# Patient Record
Sex: Female | Born: 1955 | Race: White | Hispanic: No | Marital: Married | Smoking: Never smoker
Health system: Southern US, Community
[De-identification: ages and names within clinical notes are randomized; demographics above are authoritative.]

## PROBLEM LIST (undated history)

## (undated) DIAGNOSIS — I10 Essential (primary) hypertension: Secondary | ICD-10-CM

## (undated) DIAGNOSIS — D649 Anemia, unspecified: Secondary | ICD-10-CM

## (undated) DIAGNOSIS — L570 Actinic keratosis: Secondary | ICD-10-CM

## (undated) DIAGNOSIS — F32A Depression, unspecified: Secondary | ICD-10-CM

## (undated) DIAGNOSIS — T7840XA Allergy, unspecified, initial encounter: Secondary | ICD-10-CM

## (undated) DIAGNOSIS — E559 Vitamin D deficiency, unspecified: Secondary | ICD-10-CM

## (undated) DIAGNOSIS — C439 Malignant melanoma of skin, unspecified: Secondary | ICD-10-CM

## (undated) DIAGNOSIS — M1990 Unspecified osteoarthritis, unspecified site: Secondary | ICD-10-CM

## (undated) DIAGNOSIS — Z803 Family history of malignant neoplasm of breast: Secondary | ICD-10-CM

## (undated) DIAGNOSIS — F319 Bipolar disorder, unspecified: Secondary | ICD-10-CM

## (undated) DIAGNOSIS — F329 Major depressive disorder, single episode, unspecified: Secondary | ICD-10-CM

## (undated) HISTORY — PX: DILATION AND CURETTAGE OF UTERUS: SHX78

## (undated) HISTORY — DX: Actinic keratosis: L57.0

## (undated) HISTORY — DX: Depression, unspecified: F32.A

## (undated) HISTORY — DX: Family history of malignant neoplasm of breast: Z80.3

## (undated) HISTORY — PX: HYSTEROSCOPY: SHX211

## (undated) HISTORY — DX: Allergy, unspecified, initial encounter: T78.40XA

## (undated) HISTORY — DX: Essential (primary) hypertension: I10

## (undated) HISTORY — DX: Anemia, unspecified: D64.9

## (undated) HISTORY — PX: BREAST BIOPSY: SHX20

## (undated) HISTORY — DX: Malignant melanoma of skin, unspecified: C43.9

## (undated) HISTORY — DX: Bipolar disorder, unspecified: F31.9

## (undated) HISTORY — DX: Unspecified osteoarthritis, unspecified site: M19.90

## (undated) HISTORY — PX: BREAST EXCISIONAL BIOPSY: SUR124

## (undated) HISTORY — DX: Vitamin D deficiency, unspecified: E55.9

---
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
## 2005-02-07 ENCOUNTER — Ambulatory Visit: Payer: Self-pay | Admitting: Family Medicine

## 2005-04-13 ENCOUNTER — Other Ambulatory Visit: Payer: Self-pay

## 2005-04-13 ENCOUNTER — Emergency Department: Payer: Self-pay | Admitting: Internal Medicine

## 2005-04-18 ENCOUNTER — Ambulatory Visit: Payer: Self-pay | Admitting: Family Medicine

## 2005-04-18 ENCOUNTER — Encounter: Payer: Self-pay | Admitting: Family Medicine

## 2005-04-18 ENCOUNTER — Other Ambulatory Visit: Admission: RE | Admit: 2005-04-18 | Discharge: 2005-04-18 | Payer: Self-pay | Admitting: Family Medicine

## 2005-06-18 ENCOUNTER — Ambulatory Visit: Payer: Self-pay | Admitting: Family Medicine

## 2005-07-16 ENCOUNTER — Ambulatory Visit: Payer: Self-pay | Admitting: Family Medicine

## 2005-07-17 ENCOUNTER — Ambulatory Visit: Payer: Self-pay | Admitting: Professional

## 2005-07-17 ENCOUNTER — Ambulatory Visit: Payer: Self-pay | Admitting: Family Medicine

## 2005-07-31 ENCOUNTER — Ambulatory Visit: Payer: Self-pay | Admitting: Professional

## 2005-08-07 ENCOUNTER — Ambulatory Visit: Payer: Self-pay | Admitting: Professional

## 2005-10-09 ENCOUNTER — Ambulatory Visit: Payer: Self-pay | Admitting: Professional

## 2005-10-23 ENCOUNTER — Ambulatory Visit: Payer: Self-pay | Admitting: Professional

## 2005-11-06 ENCOUNTER — Ambulatory Visit: Payer: Self-pay | Admitting: Professional

## 2005-11-20 ENCOUNTER — Ambulatory Visit: Payer: Self-pay | Admitting: Professional

## 2005-12-18 ENCOUNTER — Ambulatory Visit: Payer: Self-pay | Admitting: Professional

## 2006-01-01 ENCOUNTER — Ambulatory Visit: Payer: Self-pay | Admitting: Professional

## 2006-07-18 ENCOUNTER — Ambulatory Visit: Payer: Self-pay | Admitting: Unknown Physician Specialty

## 2006-12-03 ENCOUNTER — Ambulatory Visit: Payer: Self-pay | Admitting: Professional

## 2006-12-03 DIAGNOSIS — D239 Other benign neoplasm of skin, unspecified: Secondary | ICD-10-CM

## 2006-12-03 HISTORY — DX: Other benign neoplasm of skin, unspecified: D23.9

## 2006-12-10 ENCOUNTER — Ambulatory Visit: Payer: Self-pay | Admitting: Professional

## 2006-12-24 ENCOUNTER — Ambulatory Visit: Payer: Self-pay | Admitting: Professional

## 2007-01-21 ENCOUNTER — Ambulatory Visit: Payer: Self-pay | Admitting: Professional

## 2007-02-18 ENCOUNTER — Ambulatory Visit: Payer: Self-pay | Admitting: Professional

## 2007-07-22 ENCOUNTER — Ambulatory Visit: Payer: Self-pay | Admitting: Unknown Physician Specialty

## 2007-10-16 DIAGNOSIS — J309 Allergic rhinitis, unspecified: Secondary | ICD-10-CM | POA: Insufficient documentation

## 2007-10-16 DIAGNOSIS — K5732 Diverticulitis of large intestine without perforation or abscess without bleeding: Secondary | ICD-10-CM | POA: Insufficient documentation

## 2008-06-04 ENCOUNTER — Ambulatory Visit: Payer: Self-pay | Admitting: Family Medicine

## 2008-08-20 HISTORY — PX: MELANOMA EXCISION: SHX5266

## 2008-08-20 HISTORY — PX: COLONOSCOPY: SHX174

## 2008-09-16 ENCOUNTER — Ambulatory Visit: Payer: Self-pay | Admitting: Unknown Physician Specialty

## 2008-09-29 ENCOUNTER — Ambulatory Visit: Payer: Self-pay | Admitting: Dermatology

## 2009-05-11 ENCOUNTER — Ambulatory Visit: Payer: Self-pay | Admitting: Gastroenterology

## 2009-11-11 ENCOUNTER — Ambulatory Visit: Payer: Self-pay | Admitting: Unknown Physician Specialty

## 2010-09-06 ENCOUNTER — Ambulatory Visit: Payer: Self-pay

## 2010-09-07 ENCOUNTER — Ambulatory Visit: Payer: Self-pay

## 2010-09-11 LAB — PATHOLOGY REPORT

## 2010-12-06 ENCOUNTER — Ambulatory Visit: Payer: Self-pay | Admitting: Unknown Physician Specialty

## 2011-05-09 ENCOUNTER — Other Ambulatory Visit: Payer: Self-pay | Admitting: Sports Medicine

## 2011-05-09 DIAGNOSIS — M25561 Pain in right knee: Secondary | ICD-10-CM

## 2011-05-13 ENCOUNTER — Other Ambulatory Visit: Payer: Self-pay

## 2012-02-07 LAB — BASIC METABOLIC PANEL
BUN: 11 mg/dL (ref 4–21)
CREATININE: 0.8 mg/dL (ref 0.5–1.1)
GLUCOSE: 95 mg/dL
POTASSIUM: 4.4 mmol/L (ref 3.4–5.3)
SODIUM: 139 mmol/L (ref 137–147)

## 2012-02-07 LAB — CBC AND DIFFERENTIAL
HCT: 34 % — AB (ref 36–46)
HEMOGLOBIN: 10.9 g/dL — AB (ref 12.0–16.0)
Platelets: 442 10*3/uL — AB (ref 150–399)
WBC: 6.6 10*3/mL

## 2012-02-07 LAB — HEPATIC FUNCTION PANEL
ALT: 10 U/L (ref 7–35)
AST: 15 U/L (ref 13–35)

## 2012-02-07 LAB — LIPID PANEL
Cholesterol: 248 mg/dL — AB (ref 0–200)
HDL: 64 mg/dL (ref 35–70)
LDL Cholesterol: 159 mg/dL
Triglycerides: 124 mg/dL (ref 40–160)

## 2013-09-26 ENCOUNTER — Emergency Department: Payer: Self-pay | Admitting: Emergency Medicine

## 2013-09-27 LAB — DRUG SCREEN, URINE
Amphetamines, Ur Screen: NEGATIVE
Barbiturates, Ur Screen: NEGATIVE
Benzodiazepine, Ur Scrn: POSITIVE
Cannabinoid 50 Ng, Ur ~~LOC~~: NEGATIVE
Cocaine Metabolite,Ur ~~LOC~~: NEGATIVE
MDMA (Ecstasy)Ur Screen: NEGATIVE
Methadone, Ur Screen: NEGATIVE
Opiate, Ur Screen: NEGATIVE
Phencyclidine (PCP) Ur S: NEGATIVE
Tricyclic, Ur Screen: NEGATIVE

## 2013-09-27 LAB — COMPREHENSIVE METABOLIC PANEL WITH GFR
Albumin: 3.3 g/dL — ABNORMAL LOW
Alkaline Phosphatase: 55 U/L
Anion Gap: 6 — ABNORMAL LOW
BUN: 12 mg/dL
Bilirubin,Total: 0.3 mg/dL
Calcium, Total: 8.4 mg/dL — ABNORMAL LOW
Chloride: 106 mmol/L
Co2: 26 mmol/L
Creatinine: 0.72 mg/dL
EGFR (African American): >60
EGFR (Non-African Amer.): >60
Glucose: 130 mg/dL — ABNORMAL HIGH
Osmolality: 277
Potassium: 3.6 mmol/L
SGOT(AST): 19 U/L
SGPT (ALT): 17 U/L
Sodium: 138 mmol/L
Total Protein: 6.4 g/dL

## 2013-09-27 LAB — URINALYSIS, COMPLETE
Bilirubin,UR: NEGATIVE
Glucose,UR: 150 mg/dL
Leukocyte Esterase: NEGATIVE
Nitrite: NEGATIVE
Ph: 5
Protein: 30
Specific Gravity: 1.025

## 2013-09-27 LAB — CBC
HCT: 33.6 % — ABNORMAL LOW
HGB: 10.5 g/dL — ABNORMAL LOW
MCH: 25.7 pg — ABNORMAL LOW
MCHC: 31.3 g/dL — ABNORMAL LOW
MCV: 82 fL
Platelet: 350 x10 3/mm 3
RBC: 4.1 X10 6/mm 3
RDW: 14.2 %
WBC: 11.4 x10 3/mm 3 — ABNORMAL HIGH

## 2013-09-27 LAB — SALICYLATE LEVEL: Salicylates, Serum: <1.7 mg/dL

## 2013-09-27 LAB — ETHANOL
Ethanol %: <0.003 %
Ethanol: <3 mg/dL

## 2013-09-27 LAB — ACETAMINOPHEN LEVEL: Acetaminophen: <2 ug/mL

## 2013-10-22 ENCOUNTER — Emergency Department: Payer: Self-pay

## 2013-10-22 LAB — URINALYSIS, COMPLETE
Bilirubin,UR: NEGATIVE
Blood: NEGATIVE
Glucose,UR: NEGATIVE mg/dL (ref 0–75)
Leukocyte Esterase: NEGATIVE
Nitrite: NEGATIVE
Ph: 6 (ref 4.5–8.0)
Protein: NEGATIVE
RBC,UR: 2 /HPF (ref 0–5)
SPECIFIC GRAVITY: 1.024 (ref 1.003–1.030)
Squamous Epithelial: 4
WBC UR: 2 /HPF (ref 0–5)

## 2013-10-22 LAB — COMPREHENSIVE METABOLIC PANEL
ALBUMIN: 3.5 g/dL (ref 3.4–5.0)
ALK PHOS: 58 U/L
ALT: 13 U/L (ref 12–78)
AST: 9 U/L — AB (ref 15–37)
Anion Gap: 6 — ABNORMAL LOW (ref 7–16)
BUN: 16 mg/dL (ref 7–18)
Bilirubin,Total: 0.2 mg/dL (ref 0.2–1.0)
CHLORIDE: 107 mmol/L (ref 98–107)
CO2: 26 mmol/L (ref 21–32)
Calcium, Total: 9.1 mg/dL (ref 8.5–10.1)
Creatinine: 0.88 mg/dL (ref 0.60–1.30)
Glucose: 127 mg/dL — ABNORMAL HIGH (ref 65–99)
Osmolality: 280 (ref 275–301)
Potassium: 3.8 mmol/L (ref 3.5–5.1)
Sodium: 139 mmol/L (ref 136–145)
Total Protein: 6.7 g/dL (ref 6.4–8.2)

## 2013-10-22 LAB — DRUG SCREEN, URINE

## 2013-10-22 LAB — ACETAMINOPHEN LEVEL: Acetaminophen: <2 ug/mL

## 2013-10-22 LAB — CBC
HCT: 36.4 % (ref 35.0–47.0)
HGB: 11.9 g/dL — ABNORMAL LOW (ref 12.0–16.0)
MCH: 26.8 pg (ref 26.0–34.0)
MCHC: 32.6 g/dL (ref 32.0–36.0)
MCV: 82 fL (ref 80–100)
Platelet: 375 10*3/uL (ref 150–440)
RBC: 4.43 10*6/uL (ref 3.80–5.20)
RDW: 15.7 % — ABNORMAL HIGH (ref 11.5–14.5)
WBC: 8.3 10*3/uL (ref 3.6–11.0)

## 2013-10-22 LAB — SALICYLATE LEVEL: Salicylates, Serum: <1.7 mg/dL

## 2013-10-22 LAB — ETHANOL: Ethanol %: <0.003 % (ref 0.000–0.080)

## 2013-10-31 DIAGNOSIS — F3170 Bipolar disorder, currently in remission, most recent episode unspecified: Secondary | ICD-10-CM | POA: Insufficient documentation

## 2013-11-09 ENCOUNTER — Other Ambulatory Visit (HOSPITAL_COMMUNITY): Payer: BC Managed Care – PPO

## 2013-11-10 ENCOUNTER — Ambulatory Visit (HOSPITAL_COMMUNITY): Payer: Self-pay

## 2013-11-11 ENCOUNTER — Ambulatory Visit (HOSPITAL_COMMUNITY): Payer: Self-pay

## 2013-11-12 ENCOUNTER — Ambulatory Visit (HOSPITAL_COMMUNITY): Payer: Self-pay

## 2013-11-13 ENCOUNTER — Ambulatory Visit (HOSPITAL_COMMUNITY): Payer: Self-pay

## 2013-11-16 ENCOUNTER — Ambulatory Visit (HOSPITAL_COMMUNITY): Payer: Self-pay

## 2013-11-17 ENCOUNTER — Ambulatory Visit (HOSPITAL_COMMUNITY): Payer: Self-pay

## 2013-11-18 ENCOUNTER — Ambulatory Visit (HOSPITAL_COMMUNITY): Payer: Self-pay

## 2013-11-19 ENCOUNTER — Ambulatory Visit (HOSPITAL_COMMUNITY): Payer: Self-pay

## 2013-11-20 ENCOUNTER — Ambulatory Visit (HOSPITAL_COMMUNITY): Payer: Self-pay

## 2013-11-23 ENCOUNTER — Ambulatory Visit (HOSPITAL_COMMUNITY): Payer: Self-pay

## 2013-11-24 ENCOUNTER — Ambulatory Visit (HOSPITAL_COMMUNITY): Payer: Self-pay

## 2013-11-25 ENCOUNTER — Ambulatory Visit (HOSPITAL_COMMUNITY): Payer: Self-pay

## 2013-11-26 ENCOUNTER — Ambulatory Visit (HOSPITAL_COMMUNITY): Payer: Self-pay

## 2013-11-27 ENCOUNTER — Ambulatory Visit (HOSPITAL_COMMUNITY): Payer: Self-pay

## 2013-11-30 ENCOUNTER — Ambulatory Visit (HOSPITAL_COMMUNITY): Payer: Self-pay

## 2014-12-11 NOTE — Consult Note (Signed)
PATIENT NAME:  Paula Harding, Paula Harding MR#:  [id number] DATE OF BIRTH:  Nov 12, 1955  DATE OF CONSULTATION:  09/27/2013  REFERRING PHYSICIAN:   CONSULTING PHYSICIAN:  Modestine Scherzinger K. Kelena Garrow, MD  SUBJECTIVE: The patient was seen in consultation in the Emergency Room at [facility]. The patient is a 59 year old white female and employed as a Forensic psychologist and works for [employer]. The patient is married for many years and currently separated for 4 months because of marriage conflicts that are going on for the past 9 years. The patient was involved in a relationship with a man and she found out things about him and she was scared and thought it was not safe to be with him and so she called 911 for help and they brought her here. She was scared of that man.   PAST PSYCHIATRIC HISTORY: No previous history of inpatient hospitalization to psychiatry. No history of suicidal ideation. Being followed by her counselor for her marital conflicts which is of help.   ALCOHOL AND DRUGS: Occasional drink of alcohol. Denies street or prescription drug abuse.   MENTAL STATUS EXAMINATION: The patient is alert and oriented, calm, pleasant, and cooperative, fully aware of the situation that brought her to [facility] Emergency Room. Affect is appropriate with her mood which is low and down and concerned and depressed about her situation with this man with whom she has been involved . Been through lots of stress in her marriage and currently separated and working through with the help of counselors. No psychosis. Memory is intact. Cognition is intact. General knowledge and information is fair. Denies any ideas or plans to hurt self or others. Contracts for safety. Insight and judgment is fair and adequate.   IMPRESSION: 1.  Acute stress disorder secondary to stress related to ex-boyfriend. 2.  Major depression with marital conflicts and getting help for the same.  RECOMMENDATIONS: Discontinue involuntary commitment. Discharge the patient  home and she will get a followup appointment with her counselor and will get help as needed at local mental health clinic.   ____________________________ Wallace Cullens. Franchot Mimes, MD skc:sb D: 09/27/2013 21:10:02 ET T: 09/28/2013 07:46:21 ET JOB#: [id number]  cc: Arlyn Leak K. Franchot Mimes, MD, <Dictator> Dewain Penning MD ELECTRONICALLY SIGNED 10/03/2013 21:04

## 2014-12-11 NOTE — H&P (Signed)
PATIENT NAME:  Paula Harding, Paula Harding MR#:  [id number] DATE OF BIRTH:  02/07/1956  DATE OF ADMISSION:  10/22/2013  IDENTIFYING INFORMATION AND CHIEF COMPLAINT: This is a 59 year old woman who was brought to the Emergency Room under involuntary commitment with reports that she has become increasingly agitated, psychotic, and erratic in her behavior. The patient's chief complaint is: "I think I've been manic."   HISTORY OF PRESENT ILLNESS: Information obtained from the patient and the chart but also from conversation with the patient's son, who was spoken to with the patient's consent. The patient began to have symptoms of agitated erratic behavior and mood lability, which probably started up a couple of months ago but escalated within the past month. She became increasingly disorganized and agitated in her behavior. She has not been sleeping well. She has been acting confused in a way that is atypical for her. Went through a spell of becoming paranoid and blaming a friend of hers for supposedly trying to kill her. Became hyperverbal and communicating excessively with family, calling people for hours at a time, hundreds of text messages a day. She started to have erratic physical symptoms including complaints at times of being paralyzed and not being able to move. The patient was admitted to [facility] just a couple of weeks ago for these symptoms. She was thought to have bipolar disorder from what she says and was treated with Zyprexa primarily. The patient was discharged from the hospital according to her son earlier than the primary doctor had wanted. After coming home, she has decompensated. The patient describes paranoia, confused and psychotic thinking, trying to break into other people houses being unable to find her own home, etc. The patient reports that her mood has been up and down. She becomes tearful at times. She complains that she cannot think clearly. Admits that she has not been sleeping well.  Evidently after coming home from [facility], she started tapering off of her medications, including tapering down on her Zyprexa without medical advice, which certainly probably contributed to her decompensation. Both the patient and the family date this whole episode back to early November. At that time, the patient left her husband to whom she had been married for many years. She later started having an affair with another man. Apparently at first this was not seen by everyone as being a symptom of a mental health problem, although her therapist was increasingly concerned about it. There is no evidence here that she has tried to kill himself or been violent to anyone.   PAST PSYCHIATRIC HISTORY: The patient has been seeing a therapist for probably about a decade, but has never been hospitalized previously. No previous suicide attempts. The only medication that she can remember being treated with is this current combination of Zyprexa and trazodone and Klonopin.   PAST MEDICAL HISTORY: Some chronic pain issues, but otherwise does not appear to have significant ongoing medical problems.   CURRENT MEDICATIONS: Olanzapine 5 mg at night, melatonin 6 mg at night, gabapentin 200 mg as needed for pain, cyclobenzaprine 5 mg 3 times a day as needed for muscle spasm, clonazepam 0.5 mg twice a day as needed for anxiety, trazodone 100 mg at night, vitamin D 50,000 units once a week.   ALLERGIES: LEVAQUIN, PENICILLIN, LATEX, AND ADHESIVE TAPE.   FAMILY HISTORY: There is a positive family history of mood disorder in older members of the family.   SOCIAL HISTORY: The patient is currently living independently. 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 She left her husband back in November. She seems to have a man that she has been seeing. It is not entirely clear what their relationship is. She has adult children who are actively involved in her care.   SUBSTANCE ABUSE  HISTORY: Denies abuse of alcohol or other drugs. No known past history of substance abuse.   REVIEW OF SYSTEMS: The patient complains of mood instability. Has crying spells and depression. Has confused thinking. Poor sleep. Some hopelessness. Denies suicidal ideation. Denies hallucinations. The rest of the physical review of systems is negative.   MENTAL STATUS EXAMINATION: A somewhat disheveled woman who looks her stated age, partially cooperative with the interview. Eye contact only intermittent. Psychomotor activity fidgety and irregular. At times she will appear to be normal in her physical activity, but then will claim that she is not able to move her body at all or not able to move her arms into certain positions and then later lay down on the floor and claimed that she was too tired to move at all. Mood is erratic. She has sudden crying spells. Her thoughts are disorganized with loosening of associations. Denies acute hallucinations. Denies suicidal or homicidal ideation. Judgment and insight impaired. Intelligence average. Short-and long-term memory both appear to be currently impaired.   PHYSICAL EXAMINATION: GENERAL: The patient looks like she has probably lost some weight recently. Otherwise does not appear to be in obvious acute physical distress.  HEENT: Pupils equal and reactive. Face symmetric.  SKIN: No acute skin lesions.  NEUROMUSCULAR: She claims to not be able to move her arms above about halfway up to her shoulders, but appears to have regular range of motion in lower extremities and in her hands. Subjectively she reports that her strength is diminished, especially in her shoulders and her core. It is symmetric. Cranial nerves symmetric and normal.  LUNGS: Clear, no wheezes.  HEART: Regular rate and rhythm.  ABDOMEN: Soft, nontender, normal bowel sounds.  VITAL SIGNS: Include blood pressure 100/57, respirations 20, pulse 67, temperature 96.7.   LABORATORY RESULTS: Alcohol  negative. Salicylates and acetaminophen negative. Chemistry panel: Elevated glucose at 127. CBC: Low hemoglobin at 11.9. Urinalysis borderline, probably not infected, probably contaminated. Drug screen negative.   ASSESSMENT: This is a 59 year old woman who has had a mental breakdown and decompensation with erratic mood symptoms and thought disorder that has been getting worse for at least several weeks, probably longer than that. It has included a lack of function and ability to do her usual activities and to take care of herself. She was not compliant with recommended medication after her last hospitalization. I spoke to her son, who is strongly in favor of rehospitalization, believing that she has become unable to take care of herself and he is very worried about her safety. The patient is also agreeable to hospitalization. I think the diagnosis could include either bipolar disorder or an atypical psychotic episode such as a brief reactive psychosis. No evidence of drug abuse. No evidence of specific medical problems causing this. She does seem to need hospitalization.   TREATMENT PLAN: I have started her back on her medications as best I can, although I have also increased her Zyprexa to 10 mg instead of 5 mg at night. I have also started her on lithium 600 mg at night for what I think is probably a bipolar-like presentation. I would like to admit her to our psychiatry ward, but at this  point we have filled up. No bed available. Disposition coordinators are working with trying to refer her to [facility], where she was previously treated, see if we can transfer her there.   DIAGNOSIS, PRINCIPAL AND PRIMARY:  AXIS I: Bipolar disorder, not otherwise specified; manic symptoms.   SECONDARY DIAGNOSES: AXIS I: No diagnosis.  AXIS II: No diagnosis.  AXIS III: No diagnosis.  AXIS IV: Severe from multiple major life changes and burden of this illness.  AXIS V: Functioning at time of evaluation  37.   ____________________________ Gonzella Lex, MD jtc:jcm D: 10/23/2013 18:35:33 ET T: 10/23/2013 20:27:48 ET JOB#: [id number]  cc: Gonzella Lex, MD, <Dictator> Gonzella Lex MD ELECTRONICALLY SIGNED 11/02/2013 11:21

## 2016-02-20 DIAGNOSIS — H409 Unspecified glaucoma: Secondary | ICD-10-CM | POA: Insufficient documentation

## 2016-02-20 DIAGNOSIS — C439 Malignant melanoma of skin, unspecified: Secondary | ICD-10-CM | POA: Insufficient documentation

## 2016-02-20 DIAGNOSIS — F411 Generalized anxiety disorder: Secondary | ICD-10-CM | POA: Insufficient documentation

## 2016-12-25 ENCOUNTER — Ambulatory Visit: Payer: Self-pay | Admitting: Obstetrics and Gynecology

## 2017-01-17 ENCOUNTER — Encounter: Payer: Self-pay | Admitting: Obstetrics & Gynecology

## 2017-01-17 ENCOUNTER — Ambulatory Visit (INDEPENDENT_AMBULATORY_CARE_PROVIDER_SITE_OTHER): Payer: BLUE CROSS/BLUE SHIELD | Admitting: Obstetrics & Gynecology

## 2017-01-17 VITALS — BP 120/70 | HR 78 | Ht 67.0 in | Wt 150.0 lb

## 2017-01-17 DIAGNOSIS — Z124 Encounter for screening for malignant neoplasm of cervix: Secondary | ICD-10-CM | POA: Diagnosis not present

## 2017-01-17 DIAGNOSIS — Z1211 Encounter for screening for malignant neoplasm of colon: Secondary | ICD-10-CM

## 2017-01-17 DIAGNOSIS — Z1231 Encounter for screening mammogram for malignant neoplasm of breast: Secondary | ICD-10-CM | POA: Diagnosis not present

## 2017-01-17 DIAGNOSIS — Z1239 Encounter for other screening for malignant neoplasm of breast: Secondary | ICD-10-CM

## 2017-01-17 DIAGNOSIS — Z01419 Encounter for gynecological examination (general) (routine) without abnormal findings: Secondary | ICD-10-CM | POA: Diagnosis not present

## 2017-01-17 DIAGNOSIS — Z1329 Encounter for screening for other suspected endocrine disorder: Secondary | ICD-10-CM

## 2017-01-17 DIAGNOSIS — Z1321 Encounter for screening for nutritional disorder: Secondary | ICD-10-CM

## 2017-01-17 DIAGNOSIS — Z780 Asymptomatic menopausal state: Secondary | ICD-10-CM

## 2017-01-17 DIAGNOSIS — Z131 Encounter for screening for diabetes mellitus: Secondary | ICD-10-CM

## 2017-01-17 DIAGNOSIS — Z0000 Encounter for general adult medical examination without abnormal findings: Secondary | ICD-10-CM

## 2017-01-17 DIAGNOSIS — Z13220 Encounter for screening for lipoid disorders: Secondary | ICD-10-CM

## 2017-01-17 MED ORDER — ESTRADIOL-LEVONORGESTREL 0.045-0.015 MG/DAY TD PTWK: 1.0000 | MEDICATED_PATCH | TRANSDERMAL | 12 refills | Status: DC

## 2017-01-17 NOTE — Patient Instructions (Signed)
PAP every three years Mammogram every year Colonoscopy every 10 years Labs this year  Estradiol; Levonorgestrel skin patches What is this medicine? ESTRADIOL; LEVONORGESTREL (es tra DYE ole; LEE voh nor jes trel) is used as hormone replacement in menopausal women who still have their uterus. This medicine is used to relieve the symptoms of menopause. It also helps to prevent osteoporosis in postmenopausal women. This medicine may be used for other purposes; ask your health care provider or pharmacist if you have questions. COMMON BRAND NAME(S): Climara Pro What should I tell my health care provider before I take this medicine? They need to know if you have any of these conditions: -blood vessel disease or blood clots -breast, cervical, endometrial, or uterine cancer -diabetes -endometriosis -fibroids -gallbladder disease -heart disease or recent heart attack -high blood cholesterol -high blood pressure -high level of calcium in the blood -hysterectomy -kidney disease -liver disease -mental depression -migraine headaches -porphyria -stroke -systemic lupus erythematosus (SLE) -tobacco smoker -vaginal bleeding -an unusual or allergic reaction to estrogens, progestins, other medicines, foods, dyes, or preservatives -pregnant or trying to get pregnant -breast-feeding How should I use this medicine? This medicine is for external use only. Follow the directions on the prescription label. Tear open the pouch, do not use scissors. Remove the stiff protective liner covering the adhesive. Try not to touch the adhesive. Apply the patch, sticky side to the skin, to an area that is clean, dry and hairless. Avoid injured, irritated, calloused, or scarred areas. Do not apply the skin patches to your breasts or around the waistline. Use a different site each time to prevent skin irritation. Do not cut or trim the patch. Do not stop using except on the advice of your doctor or health care  professional. Do not wear more than one patch at a time unless you are told to do so by your doctor or health care professional. Contact your pediatrician regarding the use of this medicine in children. Special care may be needed. A patient package insert for the product will be given with each prescription and refill. Read this sheet carefully each time. The sheet may change frequently. Overdosage: If you think you have taken too much of this medicine contact a poison control center or emergency room at once. NOTE: This medicine is only for you. Do not share this medicine with others. What if I miss a dose? If you miss a dose, apply it as soon as you can. If it is almost time for your next dose, apply only that dose. Do not apply double or extra doses. What may interact with this medicine? Do not take this medicine with any of the following medications: -aromatase inhibitors like aminoglutethimide, anastrozole, exemestane, letrozole, testolactone This medicine may also interact with the following medications: -barbiturates, like phenobarbital -carbamazepine -certain antibiotics used to treat infections -grapefruit juice -medicines for fungus infections like itraconazole and ketoconazole -raloxifene or tamoxifen -rifabutin, rifampin, or rifapentine -ritonavir -St. John's Wort This list may not describe all possible interactions. Give your health care provider a list of all the medicines, herbs, non-prescription drugs, or dietary supplements you use. Also tell them if you smoke, drink alcohol, or use illegal drugs. Some items may interact with your medicine. What should I watch for while using this medicine? Visit your doctor or health care professional for regular checks on your progress. You will need a regular breast and pelvic exam and Pap smear while on this medicine. You should also discuss the need for regular mammograms  with your health care professional, and follow his or her guidelines  for these tests. This medicine can make your body retain fluid, making your fingers, hands, or ankles swell. Your blood pressure can go up. Contact your doctor or health care professional if you feel you are retaining fluid. If you have any reason to think you are pregnant, stop taking this medicine right away and contact your doctor or health care professional. Smoking increases the risk of getting a blood clot or having a stroke while you are taking this medicine, especially if you are more than 59 years old. You are strongly advised not to smoke. If you wear contact lenses and notice visual changes, or if the lenses begin to feel uncomfortable, consult your eye doctor or health care professional. If you are going to have surgery or an MRI, you may need to stop taking this medicine. Consult your health care professional for advice before you schedule the surgery. Contact with water while you are swimming, using a sauna, bathing, or showering may cause the patch to fall off. If your patch falls off reapply it. If you cannot reapply the patch, apply a new patch to another area and continue to follow your usual dose schedule. What side effects may I notice from receiving this medicine? Side effects that you should report to your doctor or health care professional as soon as possible: -breakthrough bleeding and spotting -breast tissue changes or discharge -chest pain -leg, arm or groin pain -nausea, vomiting -severe headaches -severe stomach pain -sudden shortness of breath -yellowing of the eyes or skin Side effects that usually do not require medical attention (report to your doctor or health care professional if they continue or are bothersome): -changes in sexual desire -mood changes, anxiety, depression, frustration, anger, or emotional outbursts -increased or decreased appetite -skin rash, acne, or brown spots on the skin -symptoms of vaginal infection like itching, irritation or unusual  discharge -weight gain This list may not describe all possible side effects. Call your doctor for medical advice about side effects. You may report side effects to FDA at 1-800-FDA-1088. Where should I keep my medicine? Keep out of the reach of children. Store at room temperature between 15 and 30 degrees C (59 and 86 degrees F). Do not store any patches that have been removed from their protective pouch. Throw away any unused medicine after the expiration date. Dispose of used patches properly. Since used patches may still contain active hormones, fold the patch in half so that it sticks to itself prior to disposal. NOTE: This sheet is a summary. It may not cover all possible information. If you have questions about this medicine, talk to your doctor, pharmacist, or health care provider.  2018 Elsevier/Gold Standard (2016-02-28 12:49:52)

## 2017-01-17 NOTE — Progress Notes (Signed)
HPI:      [name] is a 61 y.o. [phone] who LMP was in the past, she presents today for her annual examination.  The patient has no complaints today. The patient is sexually active. Herlast pap: approximate date 2014 and was normal and last mammogram: approximate date 2016 and was normal.  The patient does perform self breast exams.  There is notable family history of breast or ovarian cancer in her family. The patient is not taking hormone replacement therapy. Patient denies post-menopausal vaginal bleeding.   The patient has regular exercise: yes. The patient denies current symptoms of depression.    Sx's of menopause- periods have spaced out considerably (2 in last year), occas hot flash, insomnia, and mood change w irritability or anxiousness.  FH- mother did well w HRT but then declined quickly when she was taken off of it, not sure if there was a connection but pt desires to be on it based on her research and for this reason.  Last labs 2 years ago, chol was elevated (231).  Vit D low in past.  GYN Hx: Last Colonoscopy:8 years ago. Normal.  Last DEXA: never ago.    PMHx: Past Medical History:  Diagnosis Date  . Bipolar 1 disorder ([place])   . Hypertension   . Melanoma of skin ([place])   . Vitamin D deficiency    Past Surgical History:  Procedure Laterality Date  . BREAST BIOPSY     benign  . DILATION AND CURETTAGE OF UTERUS    . HYSTEROSCOPY    . MELANOMA EXCISION  2010   Family History  Problem Relation Age of Onset  . Breast cancer Paternal Aunt   . Alzheimer's disease Mother   . Diabetes Mother   . Hypertension Mother   . Diabetes Father   . Hypertension Father   . Hypertension Sister    Social History  Substance Use Topics  . Smoking status: Never Smoker  . Smokeless tobacco: Never Used  . Alcohol use Yes    Current Outpatient Prescriptions:  .  ALPRAZolam (XANAX) 0.5 MG tablet, Take by mouth., Disp: , Rfl:  .  estradiol-levonorgestrel (CLIMARA PRO)  0.045-0.015 MG/DAY, Place 1 patch onto the skin once a week., Disp: 4 patch, Rfl: 12 .  ferrous sulfate 324 (65 Fe) MG TBEC, Take by mouth., Disp: , Rfl:  .  methocarbamol (ROBAXIN) 500 MG tablet, Take by mouth., Disp: , Rfl:  .  omega-3 acid ethyl esters (LOVAZA) 1 g capsule, Take by mouth., Disp: , Rfl:  .  Vitamin D, Ergocalciferol, (DRISDOL) 50000 units CAPS capsule, Take 50,000 Units by mouth., Disp: , Rfl:  Allergies: Anesthetics, amide; Levaquin  [levofloxacin in d5w]; Penicillins; Tramadol; and Levofloxacin  Review of Systems  Constitutional: Negative for chills, fever and malaise/fatigue.  HENT: Negative for congestion, sinus pain and sore throat.   Eyes: Negative for blurred vision and pain.  Respiratory: Negative for cough and wheezing.   Cardiovascular: Negative for chest pain and leg swelling.  Gastrointestinal: Negative for abdominal pain, constipation, diarrhea, heartburn, nausea and vomiting.  Genitourinary: Negative for dysuria, frequency, hematuria and urgency.  Musculoskeletal: Negative for back pain, joint pain, myalgias and neck pain.  Skin: Negative for itching and rash.  Neurological: Negative for dizziness, tremors and weakness.  Endo/Heme/Allergies: Does not bruise/bleed easily.  Psychiatric/Behavioral: Negative for depression. The patient is not nervous/anxious and does not have insomnia.     Objective: BP 120/70   Pulse 78   Ht 5'  7" (1.702 m)   Wt 150 lb (68 kg)   LMP 01/14/2017   BMI 23.49 kg/m   Filed Weights   01/17/17 1041  Weight: 150 lb (68 kg)   Body mass index is 23.49 kg/m. Physical Exam  Constitutional: She is oriented to person, place, and time. She appears well-developed and well-nourished. No distress.  Genitourinary: Rectum normal, vagina normal and uterus normal. Pelvic exam was performed with patient supine. There is no rash or lesion on the right labia. There is no rash or lesion on the left labia. Vagina exhibits no lesion. No  bleeding in the vagina. Right adnexum does not display mass and does not display tenderness. Left adnexum does not display mass and does not display tenderness. Cervix does not exhibit motion tenderness, lesion, friability or polyp.   Uterus is mobile and midaxial. Uterus is not enlarged or exhibiting a mass.  HENT:  Head: Normocephalic and atraumatic. Head is without laceration.  Right Ear: Hearing normal.  Left Ear: Hearing normal.  Nose: No epistaxis.  No foreign bodies.  Mouth/Throat: Uvula is midline, oropharynx is clear and moist and mucous membranes are normal.  Eyes: Pupils are equal, round, and reactive to light.  Neck: Normal range of motion. Neck supple. No thyromegaly present.  Cardiovascular: Normal rate and regular rhythm.  Exam reveals no gallop and no friction rub.   No murmur heard. Pulmonary/Chest: Effort normal and breath sounds normal. No respiratory distress. She has no wheezes. Right breast exhibits no mass, no skin change and no tenderness. Left breast exhibits no mass, no skin change and no tenderness.  Abdominal: Soft. Bowel sounds are normal. She exhibits no distension. There is no tenderness. There is no rebound.  Musculoskeletal: Normal range of motion.  Neurological: She is alert and oriented to person, place, and time. No cranial nerve deficit.  Skin: Skin is warm and dry.  Psychiatric: She has a normal mood and affect. Judgment normal.  Vitals reviewed.   Assessment: Annual Exam 1. Annual physical exam   2. Screening for breast cancer   3. Screening for cervical cancer   4. Screen for colon cancer   5. Encounter for vitamin deficiency screening   6. Screening for cholesterol level   7. Screening for diabetes mellitus   8. Screening for thyroid disorder   9. Menopause     Plan:            1.  Cervical Screening-  Pap smear done today, Pap smear schedule reviewed with patient- every 3 years if normal  2. Breast screening- Exam annually and mammogram  scheduled  3. Colonoscopy every 10 years, Hemoccult testing after age 76  4. Labs Ordered today  H/o higher cholesterol, may need herbal supplemental regimen to help after we check her levels.  H/o low Vit D in past, treated.  5. Counseling for hormonal therapy: wants to change HRT or dose due to sx's evolving (will start combination continuous therapy now).  Pros and cons discussed.  Mother took HRT and had problems after she stopped, so wants to use long term if possible.  Counseled as to the evidence for and against this approach. HRT I have discussed HRT with the patient in detail.  The risk/benefits of it were reviewed.  She understands that during menopause Estrogen decreases dramatically and that this results in an increased risk of cardiovascular disease as well as osteoporosis.  We have also discussed the fact that hot flashes often result from a decrease in Estrogen,  and that by replacing Estrogen, they can often be alleviated.  We have discussed skin, vaginal and urinary tract changes that may also take place from this drop in Estrogen.  Emotional changes have also been linked to Estrogen and we have briefly discussed this.  The benefits of HRT including decrease in hot flashes, vaginal dryness, and osteoporosis were discussed.  The emotional benefit and a possible change in her cardiovascular risk profile was also reviewed.  The risks associated with Hormone Replacement Therapy were also reviewed.  The use of unopposed Estrogen and its relationship to endometrial cancer was discussed.  The addition of Progesterone and its beneficial effect on endometrial cancer was also noted.  The fact that there has been no consistent definitive studies showing an increase in breast cancer in women who use HRT was discussed with the patient.  The possible side effects including breast tenderness, fluid retention, mood changes and vaginal bleeding were discussed.  The patient was informed that this is an  elective medication and that she may choose not to take Hormone Replacement Therapy.  Literature on HRT was given, and I believe that after answering all of the patient's questions, she has an adequate and informed understanding of HRT.  Special emphasis on the WHI study, as well as several studies since that pertaining to the risks and benefits of estrogen replacement therapy were compared.  The possible limitations of these studies were discussed including the age stratification of the WHI study.  The possible role of Progesterone in these studies was discussed in detail.  I believe that the patient has an informed knowledge of the risks and benefits of HRT.  I have specifically discussed WHI findings and current updates.  Different type of hormone formulation and methods of taking hormone replacement therapy discussed.     F/U  Return in about 1 year (around 01/17/2018) for Annual.  Barnett Applebaum, MD, [facility], [facility] 01/17/2017  11:17 AM

## 2017-01-21 ENCOUNTER — Other Ambulatory Visit: Payer: BLUE CROSS/BLUE SHIELD

## 2017-01-21 ENCOUNTER — Encounter: Payer: Self-pay | Admitting: Obstetrics and Gynecology

## 2017-01-21 DIAGNOSIS — Z1321 Encounter for screening for nutritional disorder: Secondary | ICD-10-CM

## 2017-01-21 DIAGNOSIS — Z803 Family history of malignant neoplasm of breast: Secondary | ICD-10-CM | POA: Insufficient documentation

## 2017-01-21 DIAGNOSIS — Z131 Encounter for screening for diabetes mellitus: Secondary | ICD-10-CM

## 2017-01-21 DIAGNOSIS — Z13220 Encounter for screening for lipoid disorders: Secondary | ICD-10-CM

## 2017-01-21 DIAGNOSIS — Z1329 Encounter for screening for other suspected endocrine disorder: Secondary | ICD-10-CM

## 2017-01-21 LAB — IGP, APTIMA HPV
HPV APTIMA: NEGATIVE
PAP SMEAR COMMENT: 0

## 2017-01-26 LAB — HEMOGLOBIN A1C
ESTIMATED AVERAGE GLUCOSE: 117 mg/dL
HEMOGLOBIN A1C: 5.7 % — AB (ref 4.8–5.6)

## 2017-01-26 LAB — LIPID PANEL
Chol/HDL Ratio: 2.8 ratio (ref 0.0–4.4)
Cholesterol, Total: 236 mg/dL — ABNORMAL HIGH (ref 100–199)
HDL: 85 mg/dL (ref >39)
LDL Calculated: 131 mg/dL — ABNORMAL HIGH (ref 0–99)
Triglycerides: 102 mg/dL (ref 0–149)
VLDL Cholesterol Cal: 20 mg/dL (ref 5–40)

## 2017-01-26 LAB — TSH: TSH: 2.26 u[IU]/mL (ref 0.450–4.500)

## 2017-01-26 LAB — VITAMIN D 1,25 DIHYDROXY
Vitamin D 1, 25 (OH)2 Total: 48 pg/mL
Vitamin D2 1, 25 (OH)2: <10 pg/mL
Vitamin D3 1, 25 (OH)2: 43 pg/mL

## 2017-01-26 LAB — VITAMIN B12: VITAMIN B 12: 593 pg/mL (ref 232–1245)

## 2017-02-28 LAB — FECAL OCCULT BLOOD, IMMUNOCHEMICAL: Fecal Occult Bld: NEGATIVE

## 2018-04-02 ENCOUNTER — Telehealth: Payer: Self-pay | Admitting: Obstetrics & Gynecology

## 2018-04-02 NOTE — Telephone Encounter (Signed)
-----   Message from Gae Dry, MD sent at 04/01/2018  3:15 PM EDT ----- Regarding: Sch Annual Sch annual, needs MMG too

## 2018-04-02 NOTE — Telephone Encounter (Signed)
Called and left voice mail for patient to call back to be schedule °

## 2018-06-11 ENCOUNTER — Telehealth: Payer: Self-pay

## 2018-06-11 NOTE — Telephone Encounter (Signed)
Left message to remind pt to schedule her mammo

## 2018-06-11 NOTE — Telephone Encounter (Signed)
-----   Message from Gae Dry, MD sent at 06/11/2018  7:25 AM EDT ----- Regarding: MMG Received notice she has not received MMG yet as ordered at her Annual. Please check and encourage her to do this, and document conversation.

## 2019-06-10 ENCOUNTER — Other Ambulatory Visit: Payer: Self-pay | Admitting: *Deleted

## 2019-06-10 ENCOUNTER — Telehealth: Payer: Self-pay

## 2019-06-10 DIAGNOSIS — Z20822 Contact with and (suspected) exposure to covid-19: Secondary | ICD-10-CM

## 2019-06-11 LAB — NOVEL CORONAVIRUS, NAA: SARS-CoV-2, NAA: NOT DETECTED

## 2019-07-29 ENCOUNTER — Other Ambulatory Visit: Payer: Self-pay

## 2019-07-29 DIAGNOSIS — Z20822 Contact with and (suspected) exposure to covid-19: Secondary | ICD-10-CM

## 2019-07-31 LAB — NOVEL CORONAVIRUS, NAA: SARS-CoV-2, NAA: NOT DETECTED

## 2019-09-07 ENCOUNTER — Other Ambulatory Visit: Payer: Self-pay

## 2019-09-10 ENCOUNTER — Ambulatory Visit: Payer: PRIVATE HEALTH INSURANCE | Attending: Internal Medicine

## 2019-09-10 DIAGNOSIS — Z20822 Contact with and (suspected) exposure to covid-19: Secondary | ICD-10-CM | POA: Insufficient documentation

## 2019-09-11 LAB — NOVEL CORONAVIRUS, NAA: SARS-CoV-2, NAA: NOT DETECTED

## 2019-10-23 ENCOUNTER — Telehealth: Payer: Self-pay

## 2019-10-23 NOTE — Telephone Encounter (Signed)
Copied from [email or web address]. Topic: Appointment Scheduling - Scheduling Inquiry for Clinic >> Oct 23, 2019 11:33 AM Reyne Dumas L wrote: Reason for CRM:   Pt states that she sees Dr. Caryn Section and would like to schedule her physical.  I do not see where pt has seen Dr. Caryn Section in the past, but he is listed on Epic as her PCP.

## 2019-10-23 NOTE — Telephone Encounter (Signed)
Patient scheduled with Sharyn Lull as a New Patient on 11/27/2019

## 2019-11-19 DIAGNOSIS — C50911 Malignant neoplasm of unspecified site of right female breast: Secondary | ICD-10-CM

## 2019-11-19 DIAGNOSIS — C50919 Malignant neoplasm of unspecified site of unspecified female breast: Secondary | ICD-10-CM

## 2019-11-19 HISTORY — DX: Malignant neoplasm of unspecified site of unspecified female breast: C50.919

## 2019-11-19 HISTORY — DX: Malignant neoplasm of unspecified site of right female breast: C50.911

## 2019-11-27 ENCOUNTER — Ambulatory Visit (INDEPENDENT_AMBULATORY_CARE_PROVIDER_SITE_OTHER): Payer: PRIVATE HEALTH INSURANCE | Admitting: Adult Health

## 2019-11-27 ENCOUNTER — Encounter: Payer: Self-pay | Admitting: Adult Health

## 2019-11-27 ENCOUNTER — Other Ambulatory Visit: Payer: Self-pay

## 2019-11-27 VITALS — BP 122/76 | HR 90 | Temp 96.9°F | Ht 67.5 in | Wt 148.8 lb

## 2019-11-27 DIAGNOSIS — Z1239 Encounter for other screening for malignant neoplasm of breast: Secondary | ICD-10-CM | POA: Diagnosis not present

## 2019-11-27 DIAGNOSIS — Z0000 Encounter for general adult medical examination without abnormal findings: Secondary | ICD-10-CM

## 2019-11-27 DIAGNOSIS — Z1389 Encounter for screening for other disorder: Secondary | ICD-10-CM | POA: Diagnosis not present

## 2019-11-27 DIAGNOSIS — R590 Localized enlarged lymph nodes: Secondary | ICD-10-CM

## 2019-11-27 DIAGNOSIS — Z1329 Encounter for screening for other suspected endocrine disorder: Secondary | ICD-10-CM

## 2019-11-27 DIAGNOSIS — Z120 Encounter for screening for malignant neoplasm of stomach: Secondary | ICD-10-CM

## 2019-11-27 DIAGNOSIS — Z8639 Personal history of other endocrine, nutritional and metabolic disease: Secondary | ICD-10-CM | POA: Insufficient documentation

## 2019-11-27 DIAGNOSIS — Z13220 Encounter for screening for lipoid disorders: Secondary | ICD-10-CM

## 2019-11-27 DIAGNOSIS — N6311 Unspecified lump in the right breast, upper outer quadrant: Secondary | ICD-10-CM

## 2019-11-27 DIAGNOSIS — N959 Unspecified menopausal and perimenopausal disorder: Secondary | ICD-10-CM

## 2019-11-27 LAB — POCT URINALYSIS DIPSTICK
Bilirubin, UA: NEGATIVE
Blood, UA: NEGATIVE
Glucose, UA: NEGATIVE
Ketones, UA: NEGATIVE
Leukocytes, UA: NEGATIVE
Nitrite, UA: NEGATIVE
Protein, UA: NEGATIVE
Spec Grav, UA: >=1.03 — AB (ref 1.010–1.025)
Urobilinogen, UA: 0.2 E.U./dL
pH, UA: 6.5 (ref 5.0–8.0)

## 2019-11-27 NOTE — Progress Notes (Signed)
New patient visit      Patient: Paula Harding   DOB: Apr 16, 1956   64 y.o. Female  MRN: [id number] Visit Date: 11/27/2019  Today's healthcare provider: Marcille Buffy, FNP  Subjective:    Chief Complaint  Patient presents with  . Amity is a 64 y.o. female who presents today as a new patient to establish care.  HPI   Patient is a 64 year old female in no acute distress who comes to the clinic.Marland Kitchen She has a history of benign lumpectomy years ago. Mammogram is over due by around 10 years.  She has an area on right breast she feels may be fibrocystic as she has had before.   [facility]. - Dr. Kenton Kingfisher in the past - she would like to see a female for gynecology exam/ PAP and also for hormone therapy discussion.  She has occasional hot flashes. No abnormal vaginal bleeding reported.   Patient's last menstrual period was 01/14/2017. She is active exercises. She tries to eat healthy.   She sees Dr. 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 She has had other dysplastic skin lesions removed in the past as well she reports. She denies any new or changuing skin lesions. Patient  denies any fever, body aches,chills, rash, chest pain, shortness of breath, nausea, vomiting, or diarrhea.    Past Medical History:  Diagnosis Date  . Allergy   . Anemia   . Arthritis   . Bipolar 1 disorder ([place])   . Depression   . Family history of breast cancer    6/18 BRCA genetic testing letter sent  . Hypertension   . Melanoma of skin ([place])   . Vitamin D deficiency    Past Surgical History:  Procedure Laterality Date  . BREAST BIOPSY     benign  . DILATION AND CURETTAGE OF UTERUS    . HYSTEROSCOPY    . MELANOMA EXCISION  2010   Family Status  Relation Name Status  . Mother  (Not Specified)  . Father  (Not Specified)  . Sister   (Not Specified)  . Mat Aunt  (Not Specified)  . Mat Uncle  (Not Specified)  . MGM  (Not Specified)  . MGF  (Not Specified)  . PGF  (Not Specified)   Family History  Problem Relation Age of Onset  . Alzheimer's disease Mother   . Diabetes Mother   . Hypertension Mother   . Diabetes Father   . Hypertension Father   . Pancreatic cancer Father 9  . Hypertension Sister   . Breast cancer Maternal Aunt 87  . Alzheimer's disease Maternal Uncle   . Alzheimer's disease Maternal Grandmother   . Hyperlipidemia Maternal Grandmother   . Heart disease Maternal Grandfather   . Diabetes Paternal Grandfather    Social History   Socioeconomic History  . Marital status: Married    Spouse name: Not on file  . Number of children: Not on file  . Years of education: Not on file  . Highest education level: Not on file  Occupational History  . Not on file  Tobacco Use  . Smoking status: Never Smoker  . Smokeless tobacco: Never Used  Substance and Sexual Activity  . Alcohol use: Yes    Comment: occasionally   . Drug use: No  . Sexual activity: Yes  Birth control/protection: None  Other Topics Concern  . Not on file  Social History Narrative  . Not on file   Social Determinants of Health   Financial Resource Strain:   . Difficulty of Paying Living Expenses:   Food Insecurity:   . Worried About Charity fundraiser in the Last Year:   . Arboriculturist in the Last Year:   Transportation Needs:   . Film/video editor (Medical):   Marland Kitchen Lack of Transportation (Non-Medical):   Physical Activity:   . Days of Exercise per Week:   . Minutes of Exercise per Session:   Stress:   . Feeling of Stress :   Social Connections:   . Frequency of Communication with Friends and Family:   . Frequency of Social Gatherings with Friends and Family:   . Attends Religious Services:   . Active Member of Clubs or Organizations:   . Attends Archivist Meetings:   Marland Kitchen Marital Status:     Outpatient Medications Prior to Visit  Medication Sig  . [DISCONTINUED] ALPRAZolam (XANAX) 0.5 MG tablet Take by mouth.  . [DISCONTINUED] estradiol-levonorgestrel (CLIMARA PRO) 0.045-0.015 MG/DAY Place 1 patch onto the skin once a week.  . [DISCONTINUED] ferrous sulfate 324 (65 Fe) MG TBEC Take by mouth.  . [DISCONTINUED] methocarbamol (ROBAXIN) 500 MG tablet Take by mouth.  . [DISCONTINUED] omega-3 acid ethyl esters (LOVAZA) 1 g capsule Take by mouth.  . [DISCONTINUED] Vitamin D, Ergocalciferol, (DRISDOL) 50000 units CAPS capsule Take 50,000 Units by mouth.   No facility-administered medications prior to visit.   Allergies  Allergen Reactions  . Anesthetics, Amide Rash  . Levaquin  [Levofloxacin In D5w] Rash  . Penicillins Rash    Other reaction(s): Skin Rashes  . Tramadol Rash  . Levofloxacin   . Neosporin [Bacitracin-Polymyxin B] Rash    Patient Care Team: Noe Goyer, Kelby Aline, FNP as PCP - General (Family Medicine)  Review of Systems  Constitutional: Negative.   HENT: Negative.   Eyes: Negative.   Respiratory: Negative.   Cardiovascular: Negative.        Breast lump right breast x 2 years feels is fibrocystic   Gastrointestinal: Negative.   Endocrine: Negative.   Genitourinary: Negative.   Musculoskeletal: Negative.   Skin: Negative.   Allergic/Immunologic: Negative.   Neurological: Negative.   Hematological: Negative.   Psychiatric/Behavioral: Negative.        Objective:    BP 122/76 (BP Location: Left Arm, Patient Position: Sitting, Cuff Size: Normal)   Pulse 90   Temp (!) 96.9 F (36.1 C) (Temporal)   Ht 5' 7.5" (1.715 m)   Wt 148 lb 12.8 oz (67.5 kg)   LMP 01/14/2017   BMI 22.96 kg/m  Physical Exam Vitals reviewed.  Constitutional:      General: She is not in acute distress.    Appearance: She is well-developed. She is not diaphoretic.     Interventions: She is not intubated. HENT:     Head: Normocephalic and atraumatic.     Right Ear:  External ear normal.     Left Ear: External ear normal.     Nose: Nose normal.     Mouth/Throat:     Pharynx: No oropharyngeal exudate.  Eyes:     General: Lids are normal. No scleral icterus.       Right eye: No discharge.        Left eye: No discharge.     Conjunctiva/sclera: Conjunctivae normal.  Right eye: Right conjunctiva is not injected. No exudate or hemorrhage.    Left eye: Left conjunctiva is not injected. No exudate or hemorrhage.    Pupils: Pupils are equal, round, and reactive to light.  Neck:     Thyroid: No thyroid mass or thyromegaly.     Vascular: Normal carotid pulses. No carotid bruit, hepatojugular reflux or JVD.     Trachea: Trachea and phonation normal. No tracheal tenderness or tracheal deviation.     Meningeal: Brudzinski's sign and Kernig's sign absent.  Cardiovascular:     Rate and Rhythm: Normal rate and regular rhythm.     Pulses: Normal pulses.          Radial pulses are 2+ on the right side and 2+ on the left side.       Dorsalis pedis pulses are 2+ on the right side and 2+ on the left side.       Posterior tibial pulses are 2+ on the right side and 2+ on the left side.     Heart sounds: Normal heart sounds, S1 normal and S2 normal. Heart sounds not distant. No murmur. No friction rub. No gallop.   Pulmonary:     Effort: Pulmonary effort is normal. No tachypnea, bradypnea, accessory muscle usage or respiratory distress. She is not intubated.     Breath sounds: Normal breath sounds. No stridor. No wheezing or rales.  Chest:     Chest wall: No tenderness or edema.     Breasts: Tanner Score is 5.        Right: Mass and skin change (mild dimpling of skin near right nipple ) present. No swelling, bleeding or nipple discharge.        Left: Normal. No swelling, bleeding, inverted nipple, mass, nipple discharge, skin change or tenderness.       Comments: breast mass right breast 10 o'cck to 12 o'clock  on exam approixametly 30m in size, firm, round, non  tender, mildly mobile, lymphadenopathy right axilla  Abdominal:     General: Bowel sounds are normal. There is no distension or abdominal bruit.     Palpations: Abdomen is soft. There is no shifting dullness, fluid wave, hepatomegaly, splenomegaly, mass or pulsatile mass.     Tenderness: There is no abdominal tenderness. There is no guarding or rebound.     Hernia: No hernia is present.  Genitourinary:    Comments: Declined, refer to gynecology  Musculoskeletal:        General: No tenderness or deformity. Normal range of motion.     Cervical back: Full passive range of motion without pain, normal range of motion and neck supple. No edema, erythema or rigidity. No spinous process tenderness or muscular tenderness. Normal range of motion.  Lymphadenopathy:     Head:     Right side of head: No submental, submandibular, tonsillar, preauricular, posterior auricular or occipital adenopathy.     Left side of head: No submental, submandibular, tonsillar, preauricular, posterior auricular or occipital adenopathy.     Cervical: No cervical adenopathy.     Right cervical: No superficial, deep or posterior cervical adenopathy.    Left cervical: No superficial, deep or posterior cervical adenopathy.     Upper Body:     Right upper body: Axillary adenopathy present. No supraclavicular or pectoral adenopathy.     Left upper body: No supraclavicular, axillary or pectoral adenopathy.  Skin:    General: Skin is warm and dry.     Capillary Refill: Capillary refill  takes less than 2 seconds.     Coloration: Skin is not pale.     Findings: No abrasion, bruising, burn, ecchymosis, erythema, lesion, petechiae or rash.     Nails: There is no clubbing.     Comments: Multiple freckles all over skin.  Vertical healed scar to left anterior neck from previous biopsy at dermatology.  Neurological:     Mental Status: She is alert and oriented to person, place, and time.     GCS: GCS eye subscore is 4. GCS verbal  subscore is 5. GCS motor subscore is 6.     Cranial Nerves: No cranial nerve deficit.     Sensory: No sensory deficit.     Motor: No tremor, atrophy, abnormal muscle tone or seizure activity.     Coordination: Coordination normal.     Gait: Gait normal.     Deep Tendon Reflexes: Reflexes are normal and symmetric. Reflexes normal. Babinski sign absent on the right side. Babinski sign absent on the left side.     Reflex Scores:      Tricep reflexes are 2+ on the right side and 2+ on the left side.      Bicep reflexes are 2+ on the right side and 2+ on the left side.      Brachioradialis reflexes are 2+ on the right side and 2+ on the left side.      Patellar reflexes are 2+ on the right side and 2+ on the left side.      Achilles reflexes are 2+ on the right side and 2+ on the left side. Psychiatric:        Speech: Speech normal.        Behavior: Behavior normal.        Thought Content: Thought content normal.        Judgment: Judgment normal.     No results found for any visits on 11/27/19.    Assessment & Plan:      Annual physical exam - Plan: CBC with Differential/Platelet, Comprehensive Metabolic Panel (CMET), Lipid Panel w/o Chol/HDL Ratio, VITAMIN D 25 Hydroxy (Vit-D Deficiency, Fractures), 25-Hydroxy vitamin D Lcms D2+D3, Sed Rate (ESR), C-reactive protein, Thyroid Panel With TSH  H/O vitamin D deficiency  Screening breast examination - Plan: MM Digital Screening Unilat R  Encounter for screening for gastric cancer  - Plan: Ambulatory referral to Gastroenterology, MM 3D SCREEN BREAST BILATERAL, US BREAST COMPLETE UNI RIGHT INC AXILLA  Screening for thyroid disorder  Screening, lipid  Premenopausal patient - Plan: Ambulatory referral to Obstetrics / Gynecology, Hormone Panel  Screening for blood or protein in urine - Plan: POCT Urinalysis Dipstick  Breast lump on right side at 10 o'clock position - Plan: MM 3D SCREEN BREAST BILATERAL, MS DIGITAL DIAG TOMO  BILAT  Axillary lymphadenopathy - Plan: US BREAST COMPLETE UNI RIGHT INC AXILLA, MS DIGITAL DIAG TOMO BILAT  Orders Placed This Encounter  Procedures  . MM Digital Screening Unilat R    Order Specific Question:   Reason for Exam (SYMPTOM  OR DIAGNOSIS REQUIRED)    Answer:   mammogram screening - history of benign lumpectomy- noted    Order Specific Question:   Preferred imaging location?    Answer:   [facility]  . MM 3D SCREEN BREAST BILATERAL    Order Specific Question:   Reason for Exam (SYMPTOM  OR DIAGNOSIS REQUIRED)    Answer:   breast mass right breast 10 ockock to 12 oclock  on exam approixametly  79m in size, firm, round, non tender, lymphadenopathy right axilla    Order Specific Question:   Preferred imaging location?    Answer:   [facility]  . UKoreaBREAST COMPLETE UNI RIGHT INC AXILLA    Standing Status:   Future    Standing Expiration Date:   01/28/2021    Order Specific Question:   Reason for Exam (SYMPTOM  OR DIAGNOSIS REQUIRED)    Answer:   breast mass right breast 10 ockock to 12 oclock  on exam approixametly 669min size, firm, round, non tender, lymphadenopathy right axilla    Order Specific Question:   Preferred imaging location?    Answer:   [facility]  . MS DIGITAL DIAG TOMO BILAT    Order Specific Question:   Reason for Exam (SYMPTOM  OR DIAGNOSIS REQUIRED)    Answer:   breast mass right breast 10 o'cck to 12 o'clock  on exam approixametly 21m92mn size, firm, round, non tender, mildly mobile, lymphadenopathy right axilla    Order Specific Question:   [facility]?    [facility]  . CBC with Differential/Platelet  . Comprehensive Metabolic Panel (CMET)  . Lipid Panel w/o Chol/HDL Ratio  . VITAMIN D 25 Hydroxy (Vit-D Deficiency, Fractures)  . 25-Hydroxy vitamin D Lcms D2+D3  . Sed Rate (ESR)  . C-reactive protein  . Thyroid Panel With TSH  . Hormone Panel  . Ambulatory referral to Gastroenterology    Referral  Priority:   Routine    Referral Type:   Consultation    Referral Reason:   Specialty Services Required    Number of Visits Requested:   1  . Ambulatory referral to Obstetrics / Gynecology    Referral Priority:   Routine    Referral Type:   Consultation    Referral Reason:   Specialty Services Required    Referred to Provider:   CopAmalia Greenhouse Requested Specialty:   Obstetrics and Gynecology    Number of Visits Requested:   1  . POCT Urinalysis Dipstick   She will return for fasting labs.  Breast mass is concerning, she agrees to Mammogram and ultrasound. Needs diagnostic and right breast / axillary ultrasound. Axillary lymphadenopathy was noted on exam.   Yearly skin check and as needed still recommended with dermat logy given her history of melanoma in situ. Yearly eye exams and biannual dental exams recommended.  Healthy lifestyle and  Healthy diet recommended.   Return to clinic in 3 weeks for axillary recheck/ breast if needed pending imaging.   Advised patient call the office or your primary care doctor for an appointment if no improvement within 72 hours or if any symptoms change or worsen at any time  Advised ER or urgent Care if after hours or on weekend. Call 911 for emergency symptoms at any time.Patinet verbalized understanding of all instructions given/reviewed and treatment plan and has no further questions or concerns at this time.    Return in about 1 year (around 11/26/2020), or if symptoms worsen or fail to improve, for at any time for any worsening symptoms, Go to Emergency room/ urgent care if worse.    The entirety of the information documented in the History of Present Illness, Review of Systems and Physical Exam were personally obtained by me. Portions of this information were initially documented by the  Certified Medical Assistant whose name is documented in [employer] reviewed by me for thoroughness and  accuracy.  I have personally performed the exam and  reviewed the chart and it is accurate to the best of my knowledge.  Haematologist has been used and any errors in dictation or transcription are unintentional.  Kelby Aline. [place], [place] [phone] (phone) [phone] (fax)  [place]

## 2019-11-27 NOTE — Patient Instructions (Signed)
Call to schedule your screening mammogram. Your orders have been placed for your exam.  Let our office know if you have questions, concerns, or any difficulty scheduling.  If normal results then yearly screening mammograms are recommended unless you notice  Changes in your breast then you should schedule a follow up office visit. If abnormal results  Further imaging will be warranted and sooner follow up as determined by the radiologist at the Breast Center.   [facility] [address] [place], [place] [address]  Main: [phone]     

## 2019-11-29 DIAGNOSIS — I889 Nonspecific lymphadenitis, unspecified: Secondary | ICD-10-CM | POA: Insufficient documentation

## 2019-11-29 DIAGNOSIS — N6311 Unspecified lump in the right breast, upper outer quadrant: Secondary | ICD-10-CM | POA: Insufficient documentation

## 2019-11-30 ENCOUNTER — Other Ambulatory Visit: Payer: Self-pay | Admitting: Adult Health

## 2019-11-30 DIAGNOSIS — R590 Localized enlarged lymph nodes: Secondary | ICD-10-CM

## 2019-11-30 DIAGNOSIS — Z1231 Encounter for screening mammogram for malignant neoplasm of breast: Secondary | ICD-10-CM

## 2019-11-30 DIAGNOSIS — N6311 Unspecified lump in the right breast, upper outer quadrant: Secondary | ICD-10-CM

## 2019-12-01 ENCOUNTER — Encounter: Payer: Self-pay | Admitting: Adult Health

## 2019-12-01 NOTE — Progress Notes (Signed)
I ordered diagnostic TOMO mammogram bilateral breast  and ultrasound of right breast after exam findings of mass, as this is preferred.    [facility] then  sent new order for me to sign that had all of that included as well as added on ultrasound of left breast. I signed those orders from them yesterday 11/30/2019.  With the breast mass on exam, diagnostic mammogram is recommended with ultrasound.  She should follow back up with [facility] regarding this as she should be able to now schedule. Let us know if any problems or questions at that point.   Last mammogram I am able to see in Epic is from 12/06/2010, with Dr. Debby Bud [facility].

## 2019-12-01 NOTE — Progress Notes (Signed)
Resulted to MyChart:  CBC and CMP within normal limits no anemia, signs of infection, kidney and liver function, electrolytes within normal.  Thyroid is within normal limits.  Total cholesterol and LDL elevated.  Discuss lifestyle modification with patient e.g. increase exercise, fiber, fruits, vegetables, lean meat, and omega 3/fish intake and decrease saturated fat.  If patient following strict diet and exercise program already can consider low dose statin.  HDL good cholesterol is 89 and this is good and does affect total cholesterol. Triglycerides are elevated as well, adding in a over the counter mercury free fish oil daily per package may help as well as avoiding processed and fried foods, foods high in starch and wine.  CRP and Sed rate for inflammation are  within normal range.  Vitamin D is slightly low, recommend Vitamin D 3 over the counter at 4,000 international units once daily by mouth.  Recheck Lipid panel and Vitamin D  in 6 months.  Hormone panel is still pending should result by the time she has gynecology appointment.

## 2019-12-03 ENCOUNTER — Other Ambulatory Visit: Payer: Self-pay

## 2019-12-03 ENCOUNTER — Ambulatory Visit (INDEPENDENT_AMBULATORY_CARE_PROVIDER_SITE_OTHER): Payer: Self-pay | Admitting: Gastroenterology

## 2019-12-03 DIAGNOSIS — Z1211 Encounter for screening for malignant neoplasm of colon: Secondary | ICD-10-CM

## 2019-12-03 NOTE — Progress Notes (Signed)
Gastroenterology Pre-Procedure Review  Request Date: Tuesday 12/24/19 Requesting Physician: Dr. Marius Ditch  PATIENT REVIEW QUESTIONS: The patient responded to the following health history questions as indicated:    1. Are you having any GI issues? no 2. Do you have a personal history of Polyps? no 3. Do you have a family history of Colon Cancer or Polyps? yes (Dad polyps) 4. Diabetes Mellitus? no 5. Joint replacements in the past 12 months?no 6. Major health problems in the past 3 months?Scheduled for diagnostic test related to lump in breast. 7. Any artificial heart valves, MVP, or defibrillator?no    MEDICATIONS & ALLERGIES:    Patient reports the following regarding taking any anticoagulation/antiplatelet therapy:   Plavix, Coumadin, Eliquis, Xarelto, Lovenox, Pradaxa, Brilinta, or Effient? no Aspirin? no  Patient confirms/reports the following medications:  No current outpatient medications on file.   No current facility-administered medications for this visit.    Patient confirms/reports the following allergies:  Allergies  Allergen Reactions  . Anesthetics, Amide Rash  . Levaquin  [Levofloxacin In D5w] Rash  . Penicillins Rash    Other reaction(s): Skin Rashes  . Tramadol Rash  . Levofloxacin   . Neosporin [Bacitracin-Polymyxin B] Rash    No orders of the defined types were placed in this encounter.   AUTHORIZATION INFORMATION Primary Insurance: 1D#: Group #:  Secondary Insurance: 1D#: Group #:  SCHEDULE INFORMATION: Date: Monday 12/24/19 Time: Location:[facility]

## 2019-12-09 ENCOUNTER — Ambulatory Visit (INDEPENDENT_AMBULATORY_CARE_PROVIDER_SITE_OTHER): Payer: PRIVATE HEALTH INSURANCE | Admitting: Obstetrics and Gynecology

## 2019-12-09 ENCOUNTER — Other Ambulatory Visit (HOSPITAL_COMMUNITY)
Admission: RE | Admit: 2019-12-09 | Discharge: 2019-12-09 | Disposition: A | Discharge status: Home / Self Care | Payer: PRIVATE HEALTH INSURANCE | Source: Ambulatory Visit | Attending: Obstetrics and Gynecology | Admitting: Obstetrics and Gynecology

## 2019-12-09 ENCOUNTER — Other Ambulatory Visit: Payer: Self-pay

## 2019-12-09 ENCOUNTER — Encounter: Payer: Self-pay | Admitting: Obstetrics and Gynecology

## 2019-12-09 VITALS — BP 112/70 | Ht 67.5 in | Wt 148.0 lb

## 2019-12-09 DIAGNOSIS — Z01419 Encounter for gynecological examination (general) (routine) without abnormal findings: Secondary | ICD-10-CM | POA: Diagnosis not present

## 2019-12-09 DIAGNOSIS — Z1231 Encounter for screening mammogram for malignant neoplasm of breast: Secondary | ICD-10-CM | POA: Diagnosis not present

## 2019-12-09 DIAGNOSIS — Z780 Asymptomatic menopausal state: Secondary | ICD-10-CM

## 2019-12-09 DIAGNOSIS — N841 Polyp of cervix uteri: Secondary | ICD-10-CM

## 2019-12-09 DIAGNOSIS — Z124 Encounter for screening for malignant neoplasm of cervix: Secondary | ICD-10-CM | POA: Diagnosis not present

## 2019-12-09 DIAGNOSIS — Z803 Family history of malignant neoplasm of breast: Secondary | ICD-10-CM

## 2019-12-09 DIAGNOSIS — Z1151 Encounter for screening for human papillomavirus (HPV): Secondary | ICD-10-CM | POA: Diagnosis not present

## 2019-12-09 DIAGNOSIS — N939 Abnormal uterine and vaginal bleeding, unspecified: Secondary | ICD-10-CM

## 2019-12-09 LAB — CBC WITH DIFFERENTIAL/PLATELET
Basophils Absolute: 0 10*3/uL (ref 0.0–0.2)
Basos: 1 %
EOS (ABSOLUTE): 0.1 10*3/uL (ref 0.0–0.4)
Eos: 2 %
Hematocrit: 39.3 % (ref 34.0–46.6)
Hemoglobin: 13.3 g/dL (ref 11.1–15.9)
Immature Grans (Abs): 0 10*3/uL (ref 0.0–0.1)
Immature Granulocytes: 0 %
Lymphocytes Absolute: 1.7 10*3/uL (ref 0.7–3.1)
Lymphs: 36 %
MCH: 29.5 pg (ref 26.6–33.0)
MCHC: 33.8 g/dL (ref 31.5–35.7)
MCV: 87 fL (ref 79–97)
Monocytes Absolute: 0.3 10*3/uL (ref 0.1–0.9)
Monocytes: 5 %
Neutrophils Absolute: 2.7 10*3/uL (ref 1.4–7.0)
Neutrophils: 56 %
Platelets: 309 10*3/uL (ref 150–450)
RBC: 4.51 x10E6/uL (ref 3.77–5.28)
RDW: 12.9 % (ref 11.7–15.4)
WBC: 4.8 10*3/uL (ref 3.4–10.8)

## 2019-12-09 LAB — COMPREHENSIVE METABOLIC PANEL
ALT: 13 IU/L (ref 0–32)
AST: 15 IU/L (ref 0–40)
Albumin/Globulin Ratio: 1.8 (ref 1.2–2.2)
Albumin: 4.2 g/dL (ref 3.8–4.8)
Alkaline Phosphatase: 65 IU/L (ref 39–117)
BUN/Creatinine Ratio: 14 (ref 12–28)
BUN: 12 mg/dL (ref 8–27)
Bilirubin Total: 0.4 mg/dL (ref 0.0–1.2)
CO2: 24 mmol/L (ref 20–29)
Calcium: 9.9 mg/dL (ref 8.7–10.3)
Chloride: 103 mmol/L (ref 96–106)
Creatinine, Ser: 0.83 mg/dL (ref 0.57–1.00)
GFR calc Af Amer: 88 mL/min/{1.73_m2} (ref >59)
GFR calc non Af Amer: 76 mL/min/{1.73_m2} (ref >59)
Globulin, Total: 2.4 g/dL (ref 1.5–4.5)
Glucose: 90 mg/dL (ref 65–99)
Potassium: 4.5 mmol/L (ref 3.5–5.2)
Sodium: 141 mmol/L (ref 134–144)
Total Protein: 6.6 g/dL (ref 6.0–8.5)

## 2019-12-09 LAB — HORMONE PANEL (T4,TSH,FSH,TESTT,SHBG,DHEA,ETC)
DHEA-Sulfate, LCMS: 105 ug/dL
Estradiol, Serum, MS: 4.7 pg/mL
Estrone Sulfate: 15 ng/dL
Follicle Stimulating Hormone: 75 m[IU]/mL
Free T-3: 3 pg/mL
Free Testosterone, Serum: 1.7 pg/mL
Progesterone, Serum: <10 ng/dL
Sex Hormone Binding Globulin: 33.3 nmol/L
T4: 7.5 ug/dL
TSH: 3.3 uU/mL
Testosterone, Serum (Total): 14 ng/dL
Testosterone-% Free: 1.2 %
Triiodothyronine (T-3), Serum: 111 ng/dL

## 2019-12-09 LAB — THYROID PANEL WITH TSH
Free Thyroxine Index: 1.9 (ref 1.2–4.9)
T3 Uptake Ratio: 26 % (ref 24–39)
T4, Total: 7.2 ug/dL (ref 4.5–12.0)
TSH: 3.04 u[IU]/mL (ref 0.450–4.500)

## 2019-12-09 LAB — 25-HYDROXY VITAMIN D LCMS D2+D3
25-Hydroxy, Vitamin D-2: 2.2 ng/mL
25-Hydroxy, Vitamin D-3: 32 ng/mL
25-Hydroxy, Vitamin D: 34 ng/mL

## 2019-12-09 LAB — LIPID PANEL W/O CHOL/HDL RATIO
Cholesterol, Total: 267 mg/dL — ABNORMAL HIGH (ref 100–199)
HDL: 89 mg/dL (ref >39)
LDL Chol Calc (NIH): 151 mg/dL — ABNORMAL HIGH (ref 0–99)
Triglycerides: 153 mg/dL — ABNORMAL HIGH (ref 0–149)
VLDL Cholesterol Cal: 27 mg/dL (ref 5–40)

## 2019-12-09 LAB — SEDIMENTATION RATE: Sed Rate: 4 mm/hr (ref 0–40)

## 2019-12-09 LAB — VITAMIN D 25 HYDROXY (VIT D DEFICIENCY, FRACTURES): Vit D, 25-Hydroxy: 27.5 ng/mL — ABNORMAL LOW (ref 30.0–100.0)

## 2019-12-09 LAB — C-REACTIVE PROTEIN: CRP: <1 mg/L (ref 0–10)

## 2019-12-09 NOTE — Progress Notes (Signed)
PCP: Doreen Beam, FNP   Chief Complaint  Patient presents with  . Gynecologic Exam    HPI:      Paula Harding is a 64 y.o. [phone] who LMP was Patient's last menstrual period was 01/14/2017., presents today for her annual examination.  Her menses are infrequent for the past yr, lasting 5-7 days. She has never gone a year without bleeding. Was having monthly menses until about a yr ago. Then went 10 months without bleeding and then had bleeding again 4-6 months ago, after her mom passed away. Flow is always like a light period.  Dysmenorrhea none. She does not have intermenstrual bleeding. Mom had periods into her 64s too. Has monthly irritability as if she was still cycling. She does have tolerable vasomotor sx. Is interested in HRT for mental acuity. Mom had Alzheimers that worsened significantly after stopping HRT.  Sex activity: single partner. She does not have vaginal dryness. Rarely postcoital bleeding.  Last Pap: 10/19/14  Results were: no abnormalities /neg HPV DNA 2014.   Last mammogram: 2012  Results were: normal--routine follow-up in 12 months. Has appt sched. There is a FH of breast cancer in her mat aunt.  There is no FH of ovarian cancer. There is a hx of melanoma in pt. Genetic testing not done. The patient does not do self-breast exams. Noticed a RT breast mass recently and has dx mammo scheduled 12/11/19 by PCP. Hx of breast cysts excised in past.  Colonoscopy: age 55,  Repeat due after 10 years. Sched 5/21  Tobacco use: The patient denies current or previous tobacco use. Alcohol use: social drinker  No drug use. Exercise: very active  She does get adequate calcium and Vitamin D in her diet. Hx of Vit D deficiency with PCP.  Labs with PCP.   Past Medical History:  Diagnosis Date  . Allergy   . Anemia   . Arthritis   . Bipolar 1 disorder ([place])   . Depression   . Family history of breast cancer    6/18 BRCA genetic testing letter sent  .  Hypertension   . Melanoma of skin ([place])   . Vitamin D deficiency     Past Surgical History:  Procedure Laterality Date  . BREAST BIOPSY     benign  . COLONOSCOPY  2010  . DILATION AND CURETTAGE OF UTERUS    . HYSTEROSCOPY    . MELANOMA EXCISION  2010    Family History  Problem Relation Age of Onset  . Alzheimer's disease Mother   . Diabetes Mother   . Hypertension Mother   . Diabetes Father   . Hypertension Father   . Bladder Cancer Father 44  . Hypertension Sister   . Breast cancer Maternal Aunt 41  . Alzheimer's disease Maternal Uncle   . Alzheimer's disease Maternal Grandmother   . Hyperlipidemia Maternal Grandmother   . Heart disease Maternal Grandfather   . Diabetes Paternal Grandfather     Social History   Socioeconomic History  . Marital status: Married    Spouse name: Not on file  . Number of children: Not on file  . Years of education: Not on file  . Highest education level: Not on file  Occupational History  . Not on file  Tobacco Use  . Smoking status: Never Smoker  . Smokeless tobacco: Never Used  Substance and Sexual Activity  . Alcohol use: Yes    Comment: occasionally   . Drug use: No  .  Sexual activity: Yes    Birth control/protection: None  Other Topics Concern  . Not on file  Social History Narrative  . Not on file   Social Determinants of Health   Financial Resource Strain:   . Difficulty of Paying Living Expenses:   Food Insecurity:   . Worried About Charity fundraiser in the Last Year:   . Arboriculturist in the Last Year:   Transportation Needs:   . Film/video editor (Medical):   Marland Kitchen Lack of Transportation (Non-Medical):   Physical Activity:   . Days of Exercise per Week:   . Minutes of Exercise per Session:   Stress:   . Feeling of Stress :   Social Connections:   . Frequency of Communication with Friends and Family:   . Frequency of Social Gatherings with Friends and Family:   . Attends Religious Services:   .  Active Member of Clubs or Organizations:   . Attends Archivist Meetings:   Marland Kitchen Marital Status:   Intimate Partner Violence:   . Fear of Current or Ex-Partner:   . Emotionally Abused:   Marland Kitchen Physically Abused:   . Sexually Abused:     No current outpatient medications on file.     ROS:  Review of Systems  Constitutional: Negative for fatigue, fever and unexpected weight change.  Respiratory: Negative for cough, shortness of breath and wheezing.   Cardiovascular: Negative for chest pain, palpitations and leg swelling.  Gastrointestinal: Negative for blood in stool, constipation, diarrhea, nausea and vomiting.  Endocrine: Negative for cold intolerance, heat intolerance and polyuria.  Genitourinary: Positive for menstrual problem. Negative for dyspareunia, dysuria, flank pain, frequency, genital sores, hematuria, pelvic pain, urgency, vaginal bleeding, vaginal discharge and vaginal pain.  Musculoskeletal: Negative for back pain, joint swelling and myalgias.  Skin: Negative for rash.  Neurological: Negative for dizziness, syncope, light-headedness, numbness and headaches.  Hematological: Negative for adenopathy.  Psychiatric/Behavioral: Negative for agitation, confusion, sleep disturbance and suicidal ideas. The patient is not nervous/anxious.    BREAST: mass    Objective: BP 112/70   Ht 5' 7.5" (1.715 m)   Wt 148 lb (67.1 kg)   LMP 01/14/2017   BMI 22.84 kg/m    Physical Exam Constitutional:      Appearance: She is well-developed.  Genitourinary:     Vulva, vagina, cervix, uterus, right adnexa and left adnexa normal.     No vulval lesion or tenderness noted.     No vaginal discharge, erythema or tenderness.     Cervical polyp present.     Uterus is not enlarged or tender.     No right or left adnexal mass present.     Right adnexa not tender.     Left adnexa not tender.     Genitourinary Comments: LARGE ENDOCX POLYP; REMOVED AND SENT TO PATH  Neck:      Thyroid: No thyromegaly.  Cardiovascular:     Rate and Rhythm: Normal rate and regular rhythm.     Heart sounds: Normal heart sounds. No murmur.  Pulmonary:     Effort: Pulmonary effort is normal.     Breath sounds: Normal breath sounds.  Chest:     Breasts:        Right: Mass present. No nipple discharge, skin change or tenderness.        Left: No mass, nipple discharge, skin change or tenderness.    Abdominal:     Palpations: Abdomen is soft.  Tenderness: There is no abdominal tenderness. There is no guarding.  Musculoskeletal:        General: Normal range of motion.     Cervical back: Normal range of motion.  Neurological:     General: No focal deficit present.     Mental Status: She is alert and oriented to person, place, and time.     Cranial Nerves: No cranial nerve deficit.  Skin:    General: Skin is warm and dry.  Psychiatric:        Mood and Affect: Mood normal.        Behavior: Behavior normal.        Thought Content: Thought content normal.        Judgment: Judgment normal.  Vitals reviewed.    ENDOCERVICAL POLYP REM: Cervix visualized and polyp noted.  Ring forcep applied to cervix and twisting motion removed polyp intact.  Hemostasis obtained.   Assessment/Plan:  Encounter for annual routine gynecological examination  Cervical cancer screening - Plan: Cytology - PAP  Screening for HPV (human papillomavirus) - Plan: Cytology - PAP  Encounter for screening mammogram for malignant neoplasm of breast; pt has dx mammo in 2 days due to RT breast mass.  Family history of breast cancer - Plan: Integrated BRACAnalysis ([place]); MyRisk testing discussed and done today. Will call with results. Will also be important if pt has breast cancer in RT breast.  Menopause - Plan: Follicle stimulating hormone, Estradiol, US PELVIS TRANSVAGINAL NON-OB (TV ONLY); Question PMB vs normal cycling. Unusual at her age, although similar to her mom. Check labs  and GYN u/s. Also bleeding could be related to endocx polyp. Will f/u with results and mgmt.  Abnormal uterine bleeding (AUB) - Plan: US PELVIS TRANSVAGINAL NON-OB (TV ONLY)  Endocervical polyp - Plan: Surgical pathology  HRT--discussed pros/cons. Need to assess bleeding and breast mass first. Will further discuss based on upcoming results.          GYN counsel breast self exam, mammography screening, menopause, adequate intake of calcium and vitamin D, diet and exercise    F/U  Return in about 1 day (around 12/10/2019) for GYN u/s for AUB--[facility] to call pt.  Analena Gama B. Perry Brucato, PA-C 12/09/2019 3:04 PM

## 2019-12-09 NOTE — Patient Instructions (Signed)
I value your feedback and entrusting us with your care. If you get a Paula Harding patient survey, I would appreciate you taking the time to let us know about your experience today. Thank you!  As of July 30, 2019, your lab results will be released to your MyChart immediately, before I even have a chance to see them. Please give me time to review them and contact you if there are any abnormalities. Thank you for your patience.  

## 2019-12-10 LAB — ESTRADIOL: Estradiol: <5 pg/mL

## 2019-12-10 LAB — FOLLICLE STIMULATING HORMONE: FSH: 68.6 m[IU]/mL

## 2019-12-11 ENCOUNTER — Ambulatory Visit
Admission: RE | Admit: 2019-12-11 | Discharge: 2019-12-11 | Disposition: A | Discharge status: Home / Self Care | Payer: PRIVATE HEALTH INSURANCE | Source: Ambulatory Visit | Attending: Adult Health | Admitting: Adult Health

## 2019-12-11 DIAGNOSIS — R590 Localized enlarged lymph nodes: Secondary | ICD-10-CM

## 2019-12-11 DIAGNOSIS — N6311 Unspecified lump in the right breast, upper outer quadrant: Secondary | ICD-10-CM | POA: Diagnosis not present

## 2019-12-11 DIAGNOSIS — Z1231 Encounter for screening mammogram for malignant neoplasm of breast: Secondary | ICD-10-CM | POA: Diagnosis present

## 2019-12-11 LAB — CYTOLOGY - PAP
Comment: NEGATIVE
Diagnosis: NEGATIVE
High risk HPV: NEGATIVE

## 2019-12-11 LAB — SURGICAL PATHOLOGY

## 2019-12-14 ENCOUNTER — Encounter: Payer: Self-pay | Admitting: Obstetrics and Gynecology

## 2019-12-14 ENCOUNTER — Other Ambulatory Visit: Payer: Self-pay | Admitting: Adult Health

## 2019-12-14 DIAGNOSIS — R928 Other abnormal and inconclusive findings on diagnostic imaging of breast: Secondary | ICD-10-CM

## 2019-12-14 DIAGNOSIS — N6489 Other specified disorders of breast: Secondary | ICD-10-CM

## 2019-12-14 DIAGNOSIS — N6310 Unspecified lump in the right breast, unspecified quadrant: Secondary | ICD-10-CM

## 2019-12-14 NOTE — Progress Notes (Signed)
Let patient know that I signed orders for further breast imaging given radiology concern for malignancy of breast. Will be happy to place referral to surgeon if needed. Advise to keep all radiology follow up's.

## 2019-12-14 NOTE — Progress Notes (Signed)
Let patient know that I signed orders for further breast imaging given radiology concern for malignancy of breast. Will be happy to place referral to surgeon if needed. Advise to keep all radiology follow up's. Report any new or changing symptoms or concerns.  Written by Doreen Beam, FNP on 12/14/2019 10:46 AM EDT

## 2019-12-15 ENCOUNTER — Telehealth: Payer: Self-pay

## 2019-12-15 NOTE — Telephone Encounter (Signed)
-----   Message from Doreen Beam, [place] sent at 12/14/2019 10:47 AM EDT ----- Let patient know that I signed orders for further breast imaging given radiology concern for malignancy of breast. Will be happy to place referral to surgeon if needed. Advise to keep all radiology follow up's. Report any new or changing symptoms or concerns.  Written by Doreen Beam, FNP on 12/14/2019 10:46 AM EDT

## 2019-12-18 ENCOUNTER — Other Ambulatory Visit: Payer: PRIVATE HEALTH INSURANCE

## 2019-12-19 DIAGNOSIS — C787 Secondary malignant neoplasm of liver and intrahepatic bile duct: Secondary | ICD-10-CM

## 2019-12-19 DIAGNOSIS — Z1371 Encounter for nonprocreative screening for genetic disease carrier status: Secondary | ICD-10-CM

## 2019-12-19 HISTORY — DX: Secondary malignant neoplasm of liver and intrahepatic bile duct: C78.7

## 2019-12-19 HISTORY — DX: Encounter for nonprocreative screening for genetic disease carrier status: Z13.71

## 2019-12-21 ENCOUNTER — Encounter: Payer: Self-pay | Admitting: Dermatology

## 2019-12-21 ENCOUNTER — Other Ambulatory Visit: Payer: Self-pay

## 2019-12-21 ENCOUNTER — Ambulatory Visit (INDEPENDENT_AMBULATORY_CARE_PROVIDER_SITE_OTHER): Payer: PRIVATE HEALTH INSURANCE | Admitting: Dermatology

## 2019-12-21 ENCOUNTER — Ambulatory Visit (INDEPENDENT_AMBULATORY_CARE_PROVIDER_SITE_OTHER): Payer: PRIVATE HEALTH INSURANCE

## 2019-12-21 DIAGNOSIS — Z1283 Encounter for screening for malignant neoplasm of skin: Secondary | ICD-10-CM | POA: Diagnosis not present

## 2019-12-21 DIAGNOSIS — D225 Melanocytic nevi of trunk: Secondary | ICD-10-CM | POA: Diagnosis not present

## 2019-12-21 DIAGNOSIS — C50911 Malignant neoplasm of unspecified site of right female breast: Secondary | ICD-10-CM

## 2019-12-21 DIAGNOSIS — L720 Epidermal cyst: Secondary | ICD-10-CM | POA: Diagnosis not present

## 2019-12-21 DIAGNOSIS — L814 Other melanin hyperpigmentation: Secondary | ICD-10-CM

## 2019-12-21 DIAGNOSIS — D1801 Hemangioma of skin and subcutaneous tissue: Secondary | ICD-10-CM

## 2019-12-21 DIAGNOSIS — Z86018 Personal history of other benign neoplasm: Secondary | ICD-10-CM

## 2019-12-21 DIAGNOSIS — L570 Actinic keratosis: Secondary | ICD-10-CM

## 2019-12-21 DIAGNOSIS — D229 Melanocytic nevi, unspecified: Secondary | ICD-10-CM

## 2019-12-21 DIAGNOSIS — L821 Other seborrheic keratosis: Secondary | ICD-10-CM

## 2019-12-21 DIAGNOSIS — N939 Abnormal uterine and vaginal bleeding, unspecified: Secondary | ICD-10-CM

## 2019-12-21 DIAGNOSIS — Z780 Asymptomatic menopausal state: Secondary | ICD-10-CM

## 2019-12-21 DIAGNOSIS — Z86006 Personal history of melanoma in-situ: Secondary | ICD-10-CM

## 2019-12-21 DIAGNOSIS — L578 Other skin changes due to chronic exposure to nonionizing radiation: Secondary | ICD-10-CM

## 2019-12-21 NOTE — Progress Notes (Signed)
Follow-Up Visit   Subjective  Paula Harding is a 64 y.o. female who presents for the following: Annual Exam (extensive history of dysplastic nevi, lentigo maligna, and AK's ). Patient was recently diagnosed with breast cancer and she is awaiting her next step for treatment recommendations. She presents for total-body skin exam for skin cancer screening and mole check.  The following portions of the chart were reviewed this encounter and updated as appropriate:  Tobacco  Allergies  Meds  Problems  Med Hx  Surg Hx  Fam Hx     Review of Systems:  No other skin or systemic complaints except as noted in HPI or Assessment and Plan.  Objective  Well appearing patient in no apparent distress; mood and affect are within normal limits.  A full examination was performed including scalp, head, eyes, ears, nose, lips, neck, chest, axillae, abdomen, back, buttocks, bilateral upper extremities, bilateral lower extremities, hands, feet, fingers, toes, fingernails, and toenails. All findings within normal limits unless otherwise noted below.  Objective  Face: Erythematous thin papules/macules with gritty scale.   Objective  numerous see history: Scar with no evidence of recurrence.   Objective  Right Breast: Purpura at site of biopsy   Objective  Left Eye: Smooth white papule(s).   Objective  L sup med buttocks: Repigmented brown macule  Objective  R cheek x 1: Brown waxy papule   Assessment & Plan  AK (actinic keratosis) Face  Destruction of lesion - Face Complexity: simple   Destruction method: cryotherapy   Informed consent: discussed and consent obtained   Timeout:  patient name, date of birth, surgical site, and procedure verified Lesion destroyed using liquid nitrogen: Yes   Region frozen until ice ball extended beyond lesion: Yes   Outcome: patient tolerated procedure well with no complications   Post-procedure details: wound care instructions given     History of dysplastic nevus numerous see history  Clear. Observe for recurrence. Call clinic for new or changing lesions.  Recommend regular skin exams, daily broad-spectrum spf 30+ sunscreen use, and photoprotection.     Malignant neoplasm of right female breast, unspecified estrogen receptor status, unspecified site of breast (Peridot) Right Breast  Continue therapy/treatment with oncology   [place] Left Eye  Discussed extraction - may resolve on its own, RTC if lesions grow larger  Nevus L sup med buttocks  Benign, observe. Biopsy proven   Seborrheic keratosis R cheek x 1  Previously treated at another visit - no cost today   Destruction of lesion - R cheek x 1 Complexity: simple   Destruction method: cryotherapy   Informed consent: discussed and consent obtained   Timeout:  patient name, date of birth, surgical site, and procedure verified Lesion destroyed using liquid nitrogen: Yes   Region frozen until ice ball extended beyond lesion: Yes   Outcome: patient tolerated procedure well with no complications   Post-procedure details: wound care instructions given     Actinic Damage - diffuse scaly erythematous macules with underlying dyspigmentation - Recommend daily broad spectrum sunscreen SPF 30+ to sun-exposed areas, reapply every 2 hours as needed.  - Call for new or changing lesions.  Seborrheic Keratoses - Stuck-on, waxy, tan-brown papules and plaques  - Discussed benign etiology and prognosis. - Observe - Call for any changes  Lentigines - Scattered tan macules - Discussed due to sun exposure - Benign, observe - Call for any changes  Melanocytic Nevi - Tan-brown and/or pink-flesh-colored symmetric macules and papules - Benign appearing  on exam today - Observation - Call clinic for new or changing moles - Recommend daily use of broad spectrum spf 30+ sunscreen to sun-exposed areas.   Hemangiomas - Red papules - Discussed benign nature - Observe -  Call for any changes  History of Melanoma in Situ - No evidence of recurrence today - No lymphadenopathy - Recommend regular full body skin exams - Recommend daily broad spectrum sunscreen SPF 30+ to sun-exposed areas, reapply every 2 hours as needed.  - Call if any new or changing lesions are noted between office visits  Skin cancer screening performed today.  Return in about 6 months (around 06/22/2020) for TBSE.  Luther Redo, CMA, am acting as scribe for Sarina Ser, MD .  Documentation: I have reviewed the above documentation for accuracy and completeness, and I agree with the above.  Sarina Ser, MD

## 2019-12-22 ENCOUNTER — Other Ambulatory Visit: Admission: RE | Admit: 2019-12-22 | Payer: PRIVATE HEALTH INSURANCE | Source: Ambulatory Visit

## 2019-12-22 ENCOUNTER — Encounter: Payer: Self-pay | Admitting: Obstetrics and Gynecology

## 2019-12-23 ENCOUNTER — Encounter: Payer: Self-pay | Admitting: Obstetrics and Gynecology

## 2019-12-24 ENCOUNTER — Ambulatory Visit: Admit: 2019-12-24 | Payer: Self-pay | Admitting: Gastroenterology

## 2019-12-24 ENCOUNTER — Telehealth: Payer: Self-pay

## 2019-12-24 SURGERY — COLONOSCOPY WITH PROPOFOL
Anesthesia: General

## 2019-12-24 NOTE — Telephone Encounter (Signed)
Ok. Can just file her paperwork. Thx.

## 2019-12-24 NOTE — Telephone Encounter (Signed)
Received fax from [employer] that case had been cancelled due to missing information (pt response). I called pt to follow up, says son is a Dietitian and he has set her up through another facility to have the genetic testing done. I asked the name of the facility and she said she cannot remember, that I could call back tomorrow to get that info. Per pt's request, CANCEL test here!

## 2020-01-01 ENCOUNTER — Encounter: Payer: Self-pay | Admitting: Obstetrics and Gynecology

## 2020-01-01 ENCOUNTER — Other Ambulatory Visit (HOSPITAL_COMMUNITY)
Admission: RE | Admit: 2020-01-01 | Discharge: 2020-01-01 | Disposition: A | Discharge status: Home / Self Care | Payer: PRIVATE HEALTH INSURANCE | Source: Ambulatory Visit | Attending: Obstetrics and Gynecology | Admitting: Obstetrics and Gynecology

## 2020-01-01 ENCOUNTER — Other Ambulatory Visit: Payer: Self-pay

## 2020-01-01 ENCOUNTER — Ambulatory Visit (INDEPENDENT_AMBULATORY_CARE_PROVIDER_SITE_OTHER): Payer: PRIVATE HEALTH INSURANCE | Admitting: Obstetrics and Gynecology

## 2020-01-01 VITALS — BP 124/60 | Ht 67.5 in | Wt 147.0 lb

## 2020-01-01 DIAGNOSIS — N950 Postmenopausal bleeding: Secondary | ICD-10-CM

## 2020-01-01 DIAGNOSIS — R9389 Abnormal findings on diagnostic imaging of other specified body structures: Secondary | ICD-10-CM

## 2020-01-01 DIAGNOSIS — N841 Polyp of cervix uteri: Secondary | ICD-10-CM

## 2020-01-01 NOTE — Progress Notes (Signed)
  Endometrial Biopsy After discussion with the patient regarding her abnormal uterine bleeding I recommended that she proceed with an endometrial biopsy for further diagnosis. The risks, benefits, alternatives, and indications for an endometrial biopsy were discussed with the patient in detail. She understood the risks including infection, bleeding, cervical laceration and uterine perforation.  Verbal consent was obtained.   PROCEDURE NOTE:  [place] endometrial biopsy was performed using aseptic technique with iodine preparation.  The uterus was sounded to a length of 8 cm.  Adequate sampling was obtained, somewhat scant fragments, with minimal blood loss.  Endocervical polyp fragment grasped with Allis clamp, twisted and removed. The patient tolerated the procedure well.  Disposition will be pending pathology.  Adrian Prows MD [facility] 01/01/2020 10:39 AM

## 2020-01-01 NOTE — Patient Instructions (Signed)

## 2020-01-04 ENCOUNTER — Encounter: Payer: Self-pay | Admitting: Obstetrics and Gynecology

## 2020-01-05 LAB — SURGICAL PATHOLOGY

## 2020-06-16 ENCOUNTER — Encounter: Payer: Self-pay | Admitting: Obstetrics and Gynecology

## 2020-06-22 ENCOUNTER — Encounter: Payer: Self-pay | Admitting: Dermatology

## 2020-06-22 ENCOUNTER — Ambulatory Visit (INDEPENDENT_AMBULATORY_CARE_PROVIDER_SITE_OTHER): Payer: PRIVATE HEALTH INSURANCE | Admitting: Dermatology

## 2020-06-22 ENCOUNTER — Other Ambulatory Visit: Payer: Self-pay

## 2020-06-22 DIAGNOSIS — L650 Telogen effluvium: Secondary | ICD-10-CM | POA: Diagnosis not present

## 2020-06-22 DIAGNOSIS — Z86018 Personal history of other benign neoplasm: Secondary | ICD-10-CM

## 2020-06-22 DIAGNOSIS — Z1283 Encounter for screening for malignant neoplasm of skin: Secondary | ICD-10-CM

## 2020-06-22 DIAGNOSIS — L578 Other skin changes due to chronic exposure to nonionizing radiation: Secondary | ICD-10-CM

## 2020-06-22 DIAGNOSIS — L814 Other melanin hyperpigmentation: Secondary | ICD-10-CM | POA: Diagnosis not present

## 2020-06-22 DIAGNOSIS — D1800 Hemangioma unspecified site: Secondary | ICD-10-CM

## 2020-06-22 DIAGNOSIS — C7981 Secondary malignant neoplasm of breast: Secondary | ICD-10-CM

## 2020-06-22 DIAGNOSIS — L853 Xerosis cutis: Secondary | ICD-10-CM

## 2020-06-22 DIAGNOSIS — D229 Melanocytic nevi, unspecified: Secondary | ICD-10-CM

## 2020-06-22 DIAGNOSIS — L821 Other seborrheic keratosis: Secondary | ICD-10-CM

## 2020-06-22 DIAGNOSIS — Z85820 Personal history of malignant melanoma of skin: Secondary | ICD-10-CM

## 2020-06-22 MED ORDER — BIMATOPROST 0.03 % EX SOLN: 1.0000 "application " | Freq: Every day | CUTANEOUS | 12 refills | Status: DC

## 2020-06-22 NOTE — Patient Instructions (Addendum)
Rogain for women 5% 2 times a day Biotin 2.5mg  daily Red light treatment - Capillus, Hairmax

## 2020-06-22 NOTE — Progress Notes (Signed)
   Follow-Up Visit   Subjective  Paula Harding is a 64 y.o. 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 The patient presents for Total-Body Skin Exam (TBSE) for skin cancer screening and mole check.  The following portions of the chart were reviewed this encounter and updated as appropriate:  Tobacco  Allergies  Meds  Problems  Med Hx  Surg Hx  Fam Hx     Review of Systems:  No other skin or systemic complaints except as noted in HPI or Assessment and Plan.  Objective  Well appearing patient in no apparent distress; mood and affect are within normal limits.  A full examination was performed including scalp, head, eyes, ears, nose, lips, neck, chest, axillae, abdomen, back, buttocks, bilateral upper extremities, bilateral lower extremities, hands, feet, fingers, toes, fingernails, and toenails. All findings within normal limits unless otherwise noted below.  Objective  Scalp, eyebrows: Brow and scalp hair thinning   Assessment & Plan   Lentigines - Scattered tan macules - Discussed due to sun exposure - Benign, observe - Call for any changes  Seborrheic Keratoses - Stuck-on, waxy, tan-brown papules and plaques  - Discussed benign etiology and prognosis. - Observe - Call for any changes  Melanocytic Nevi - Tan-brown and/or pink-flesh-colored symmetric macules and papules - Benign appearing on exam today - Observation - Call clinic for new or changing moles - Recommend daily use of broad spectrum spf 30+ sunscreen to sun-exposed areas.   Hemangiomas - Red papules - Discussed benign nature - Observe - Call for any changes  Actinic Damage - chronic, secondary to cumulative UV exposure - diffuse scaly erythematous macules with underlying  dyspigmentation - Recommend daily broad spectrum sunscreen SPF 30+ to sun-exposed areas, reapply every 2 hours as needed.  - Call for new or changing lesions.  Skin cancer screening performed today.  History of Melanoma, Lentigo Maligna - No evidence of recurrence today - No lymphadenopathy - Recommend regular full body skin exams - Recommend daily broad spectrum sunscreen SPF 30+ to sun-exposed areas, reapply every 2 hours as needed.  - Call if any new or changing lesions are noted between office visits - L lat base of neck, excised 2010  History of Dysplastic Nevi - No evidence of recurrence today - Recommend regular full body skin exams - Recommend daily broad spectrum sunscreen SPF 30+ to sun-exposed areas, reapply every 2 hours as needed.  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   R/t Ibrance/Letrozole and Breast Ca  Recommend Rogain 5% bid Recommend Biotin 2.5mg  qd Discussed Red light treatment  bimatoprost (LATISSE) 0.03 % ophthalmic solution - Scalp, eyebrows  Return in about 6 months (around 12/20/2020) for TBSE, hx of Lentigo Maligna, Dysplastic Nevi, Aks.   I, Othelia Pulling, RMA, am acting as scribe for Sarina Ser, MD .  Documentation: I have reviewed the above documentation for accuracy and completeness, and I agree with the above.  Sarina Ser, MD

## 2020-12-28 ENCOUNTER — Ambulatory Visit (INDEPENDENT_AMBULATORY_CARE_PROVIDER_SITE_OTHER): Payer: PRIVATE HEALTH INSURANCE | Admitting: Dermatology

## 2020-12-28 ENCOUNTER — Encounter: Payer: Self-pay | Admitting: Dermatology

## 2020-12-28 ENCOUNTER — Other Ambulatory Visit: Payer: Self-pay

## 2020-12-28 DIAGNOSIS — D1800 Hemangioma unspecified site: Secondary | ICD-10-CM

## 2020-12-28 DIAGNOSIS — L820 Inflamed seborrheic keratosis: Secondary | ICD-10-CM

## 2020-12-28 DIAGNOSIS — Z86018 Personal history of other benign neoplasm: Secondary | ICD-10-CM

## 2020-12-28 DIAGNOSIS — Z1283 Encounter for screening for malignant neoplasm of skin: Secondary | ICD-10-CM | POA: Diagnosis not present

## 2020-12-28 DIAGNOSIS — L988 Other specified disorders of the skin and subcutaneous tissue: Secondary | ICD-10-CM

## 2020-12-28 DIAGNOSIS — L570 Actinic keratosis: Secondary | ICD-10-CM | POA: Diagnosis not present

## 2020-12-28 DIAGNOSIS — L578 Other skin changes due to chronic exposure to nonionizing radiation: Secondary | ICD-10-CM

## 2020-12-28 DIAGNOSIS — Z85820 Personal history of malignant melanoma of skin: Secondary | ICD-10-CM | POA: Diagnosis not present

## 2020-12-28 DIAGNOSIS — L814 Other melanin hyperpigmentation: Secondary | ICD-10-CM

## 2020-12-28 DIAGNOSIS — L650 Telogen effluvium: Secondary | ICD-10-CM

## 2020-12-28 DIAGNOSIS — D229 Melanocytic nevi, unspecified: Secondary | ICD-10-CM

## 2020-12-28 DIAGNOSIS — L821 Other seborrheic keratosis: Secondary | ICD-10-CM

## 2020-12-28 DIAGNOSIS — L659 Nonscarring hair loss, unspecified: Secondary | ICD-10-CM

## 2020-12-28 DIAGNOSIS — C50919 Malignant neoplasm of unspecified site of unspecified female breast: Secondary | ICD-10-CM

## 2020-12-28 NOTE — Progress Notes (Signed)
Follow-Up Visit   Subjective  Paula Harding is a 65 y.o. female who presents for the following: Annual Exam (Hx MM, dysplastic nevi ). The patient presents for Total-Body Skin Exam (TBSE) for skin cancer screening and mole check.  The following portions of the chart were reviewed this encounter and updated as appropriate:   Tobacco  Allergies  Meds  Problems  Med Hx  Surg Hx  Fam Hx     Review of Systems:  No other skin or systemic complaints except as noted in HPI or Assessment and Plan.  Objective  Well appearing patient in no apparent distress; mood and affect are within normal limits.  A full examination was performed including scalp, head, eyes, ears, nose, lips, neck, chest, axillae, abdomen, back, buttocks, bilateral upper extremities, bilateral lower extremities, hands, feet, fingers, toes, fingernails, and toenails. All findings within normal limits unless otherwise noted below.  Objective  L mid lat dorsum nose x 1, L lat upper lip x 1, R med cheek x 1 (3): Erythematous thin papules/macules with gritty scale.   Objective  L med lower eyelid x 1, face x 5 (6): Erythematous keratotic or waxy stuck-on papule or plaque.   Objective  Face: Rhytides and volume loss.   Objective  Scalp: Diffuse thinning of hair.  Assessment & Plan  AK (actinic keratosis) (3) L mid lat dorsum nose x 1, L lat upper lip x 1, R med cheek x 1  Destruction of lesion - L mid lat dorsum nose x 1, L lat upper lip x 1, R med cheek x 1 Complexity: simple   Destruction method: cryotherapy   Informed consent: discussed and consent obtained   Timeout:  patient name, date of birth, surgical site, and procedure verified Lesion destroyed using liquid nitrogen: Yes   Region frozen until ice ball extended beyond lesion: Yes   Outcome: patient tolerated procedure well with no complications   Post-procedure details: wound care instructions given    Destruction of lesion - L mid lat dorsum  nose x 1, L lat upper lip x 1, R med cheek x 1  Inflamed seborrheic keratosis (6) L med lower eyelid x 1, face x 5  Destruction of lesion - L med lower eyelid x 1, face x 5 Complexity: simple   Destruction method: cryotherapy   Informed consent: discussed and consent obtained   Timeout:  patient name, date of birth, surgical site, and procedure verified Lesion destroyed using liquid nitrogen: Yes   Region frozen until ice ball extended beyond lesion: Yes   Outcome: patient tolerated procedure well with no complications   Post-procedure details: wound care instructions given    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 Discussed LLL treatment (coupon previously given).    Lentigines - Scattered tan macules - Due to sun exposure - Benign-appering, observe - Recommend daily broad spectrum sunscreen SPF 30+ to sun-exposed areas, reapply every 2 hours as needed. - Call for any changes  Seborrheic Keratoses - Stuck-on, waxy, tan-brown papules and/or plaques  - Benign-appearing - Discussed benign etiology and prognosis. - Observe - Call for any changes  Melanocytic Nevi - Tan-brown and/or pink-flesh-colored symmetric macules and papules - Benign appearing on exam today - Observation - Call clinic for new or changing moles -  Recommend daily use of broad spectrum spf 30+ sunscreen to sun-exposed areas.   Hemangiomas - Red papules - Discussed benign nature - Observe - Call for any changes  Actinic Damage - Chronic condition, secondary to cumulative UV/sun exposure - diffuse scaly erythematous macules with underlying dyspigmentation - Recommend daily broad spectrum sunscreen SPF 30+ to sun-exposed areas, reapply every 2 hours as needed.  - Staying  in the shade or wearing long sleeves, sun glasses (UVA+UVB protection) and wide brim hats (4-inch brim around the entire circumference of the hat) are also recommended for sun protection.  - Call for new or changing lesions.  History of Dysplastic Nevi - numerous see history  - No evidence of recurrence today - Recommend regular full body skin exams - Recommend daily broad spectrum sunscreen SPF 30+ to sun-exposed areas, reapply every 2 hours as needed.  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  - Call if any new or changing lesions are noted between office visits  Metastatic Breast Cancer, Stage 4 -Patient currently being treated at [facility]  Skin cancer screening performed today.  Return in about 3 months (around 03/30/2021) for AK and ISK follow up .  Luther Redo, CMA, am acting as scribe for Sarina Ser, MD .  Documentation: I have reviewed the above documentation for accuracy and completeness, and I agree with the above.  Sarina Ser, MD

## 2020-12-28 NOTE — Patient Instructions (Addendum)
If you have any questions or concerns for your doctor, please call our main line at [phone] and press option 4 to reach your doctor's medical assistant. If no one answers, please leave a voicemail as directed and we will return your call as soon as possible. Messages left after 4 pm will be answered the following business day.   You may also send us a message via MyChart. We typically respond to MyChart messages within 1-2 business days.  For prescription refills, please ask your pharmacy to contact our office. Our fax number is [phone].  If you have an urgent issue when the clinic is closed that cannot wait until the next business day, you can page your doctor at the number below.    Please note that while we do our best to be available for urgent issues outside of office hours, we are not available 24/7.   If you have an urgent issue and are unable to reach us, you may choose to seek medical care at your doctor's office, retail clinic, urgent care center, or emergency room.  If you have a medical emergency, please immediately call 911 or go to the emergency department.  Pager Numbers  - Dr. Kowalski: [phone]  - Dr. Moye: [phone]  - Dr. Stewart: [phone]  In the event of inclement weather, please call our main line at [phone] for an update on the status of any delays or closures.  Dermatology Medication Tips: Please keep the boxes that topical medications come in in order to help keep track of the instructions about where and how to use these. Pharmacies typically print the medication instructions only on the boxes and not directly on the medication tubes.   If your medication is too expensive, please contact our office at [phone] option 4 or send us a message through MyChart.   We are unable to tell what your co-pay for medications will be in advance as this is different depending on your insurance coverage. However, we may be able to find a substitute  medication at lower cost or fill out paperwork to get insurance to cover a needed medication.   If a prior authorization is required to get your medication covered by your insurance company, please allow us 1-2 business days to complete this process.  Drug prices often vary depending on where the prescription is filled and some pharmacies may offer cheaper prices.  The website www.goodrx.com contains coupons for medications through different pharmacies. The prices here do not account for what the cost may be with help from insurance (it may be cheaper with your insurance), but the website can give you the price if you did not use any insurance.  - You can print the associated coupon and take it with your prescription to the pharmacy.  - You may also stop by our office during regular business hours and pick up a GoodRx coupon card.  - If you need your prescription sent electronically to a different pharmacy, notify our office through [facility] MyChart or by phone at [phone] option 4.  Instructions for [facility] Medications  One or more of your medications was sent to the [facility] mail order compounding pharmacy. You will receive an email from them and can purchase the medicine through that link. It will then be mailed to your home at the address you confirmed. If for any reason you do not receive an email from them, please check your spam folder. If you still do not find the email,   please let us know. [facility] phone number is [phone].   

## 2021-01-03 ENCOUNTER — Encounter: Payer: Self-pay | Admitting: Dermatology

## 2021-02-07 ENCOUNTER — Other Ambulatory Visit: Payer: Self-pay | Admitting: Adult Health

## 2021-02-07 DIAGNOSIS — N6310 Unspecified lump in the right breast, unspecified quadrant: Secondary | ICD-10-CM

## 2021-02-07 DIAGNOSIS — R928 Other abnormal and inconclusive findings on diagnostic imaging of breast: Secondary | ICD-10-CM

## 2021-02-07 DIAGNOSIS — N6489 Other specified disorders of breast: Secondary | ICD-10-CM

## 2021-02-27 IMAGING — US US BREAST*R* LIMITED INC AXILLA
2 series · 13 of 25 positions shown · non-contrast
Comparison: Previous exam(s).

CLINICAL DATA: 63-year-old female presenting for evaluation of a
palpable lump the right breast. Her physician also palpated an
abnormality in her right axilla.

EXAM:
DIGITAL DIAGNOSTIC BILATERAL MAMMOGRAM WITH CAD AND TOMO
ULTRASOUND BILATERAL BREAST

[Series 1: us breast*right* limited inc axilla · 0.09mm/px · 4 of 8 slices shown (1 of 2)]
[im 1/8]
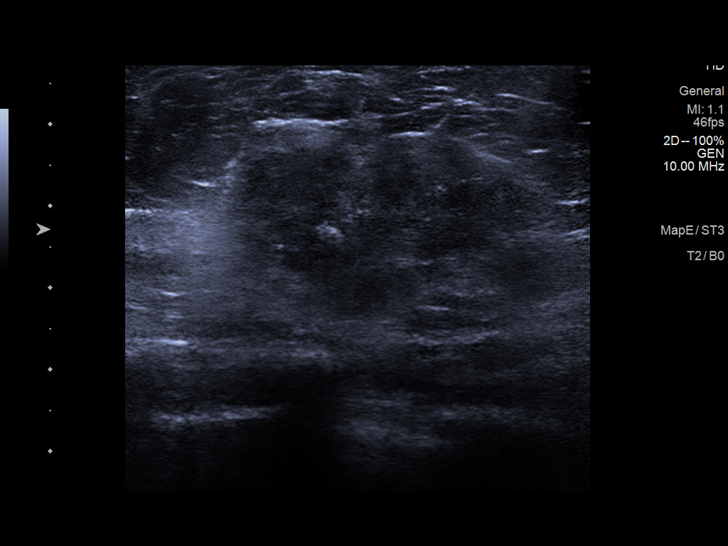
[im 3/8]
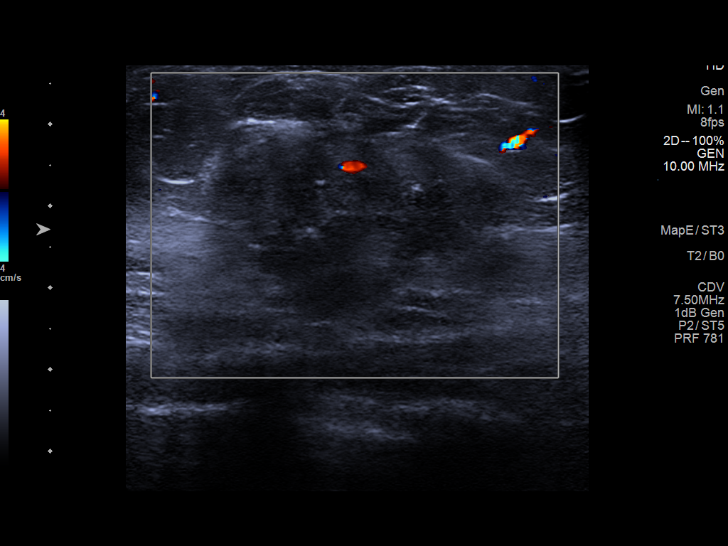
[im 5/8]
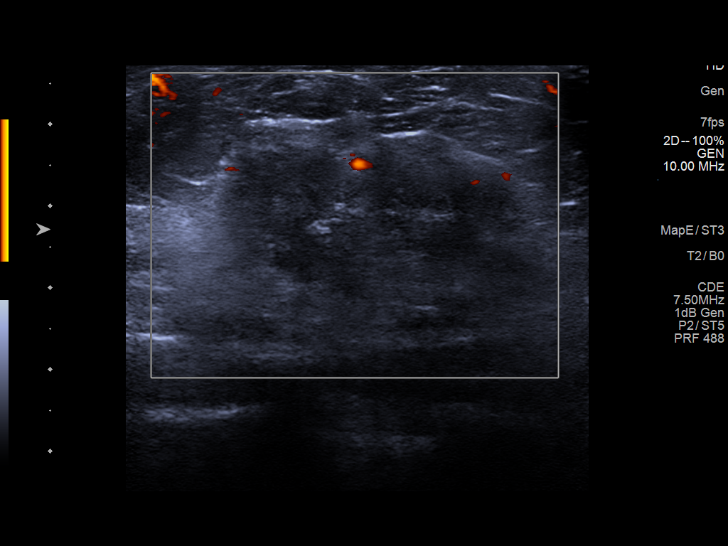
[im 7/8]
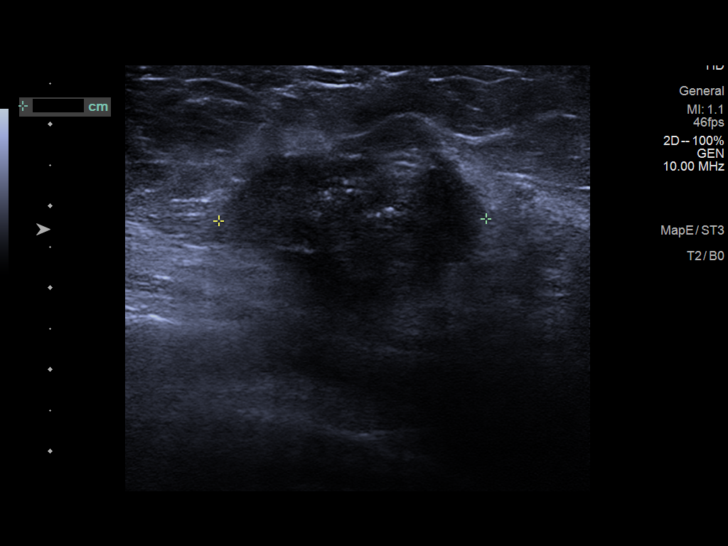

[Series 2: us breast*right* limited inc axilla · 0.07mm/px · 9 of 18 slices shown (2 of 2)]
[im 1/18]
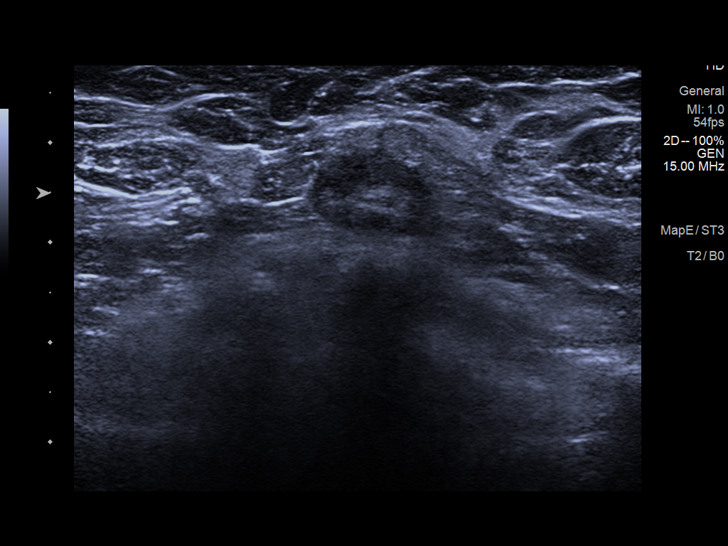
[im 3/18]
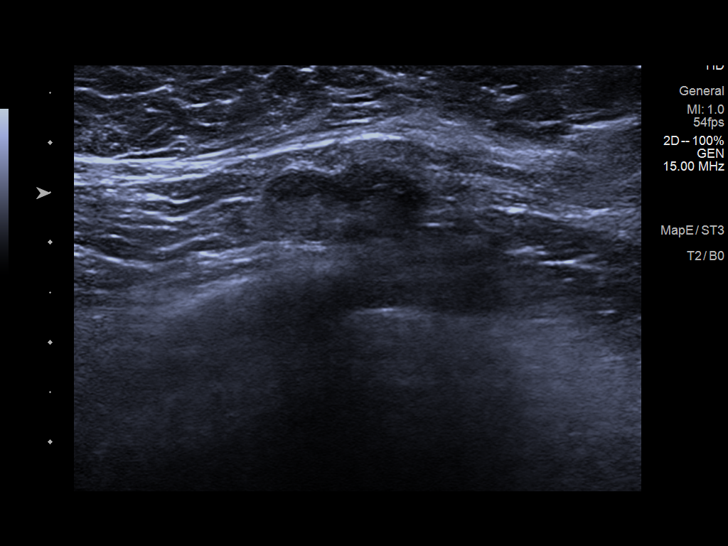
[im 5/18]
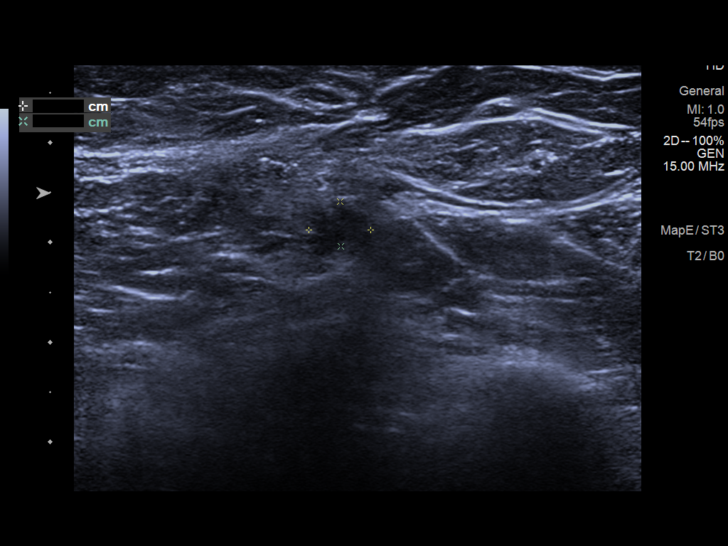
[im 7/18]
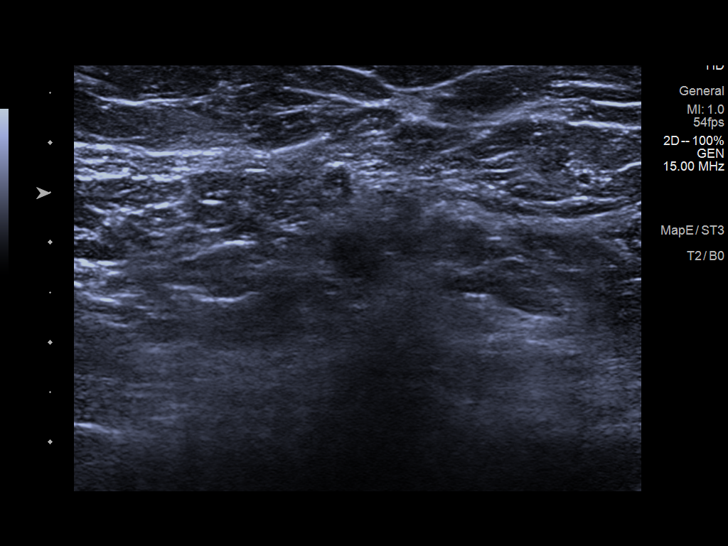
[im 9/18]
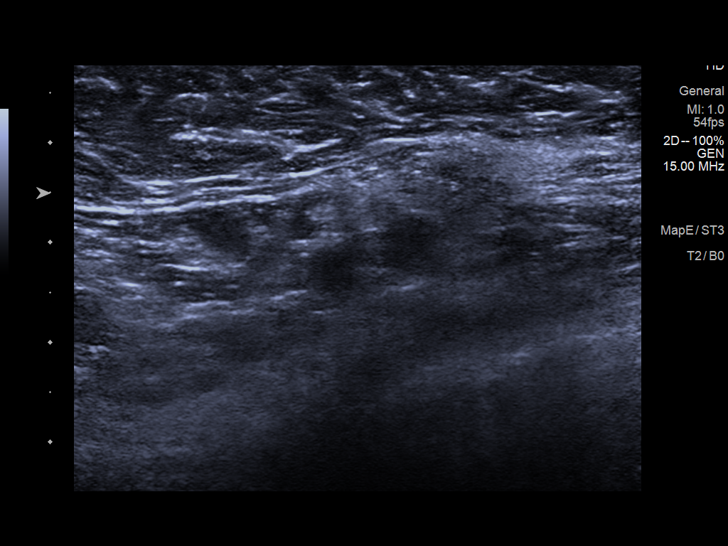
[im 11/18]
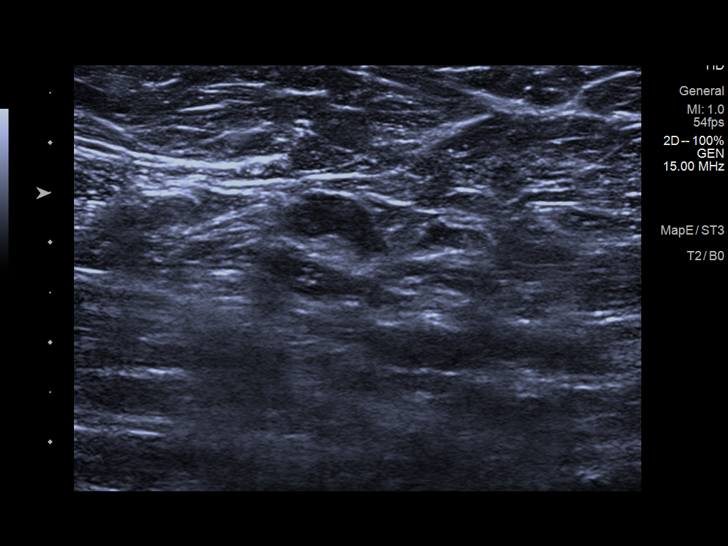
[im 13/18]
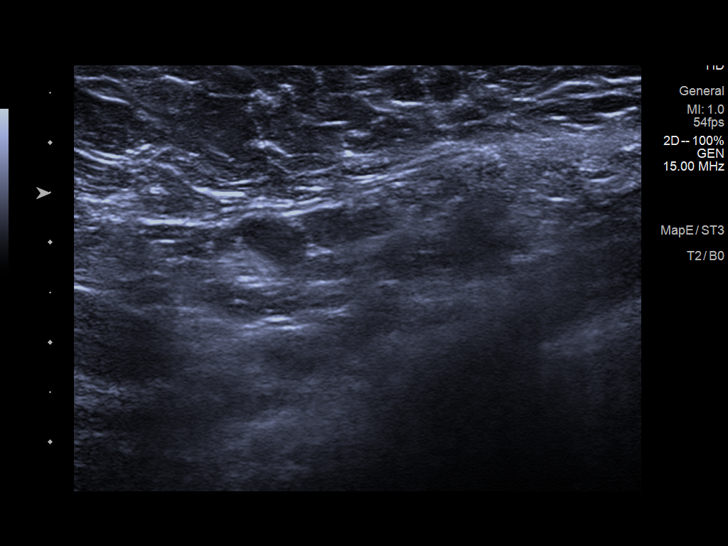
[im 15/18]
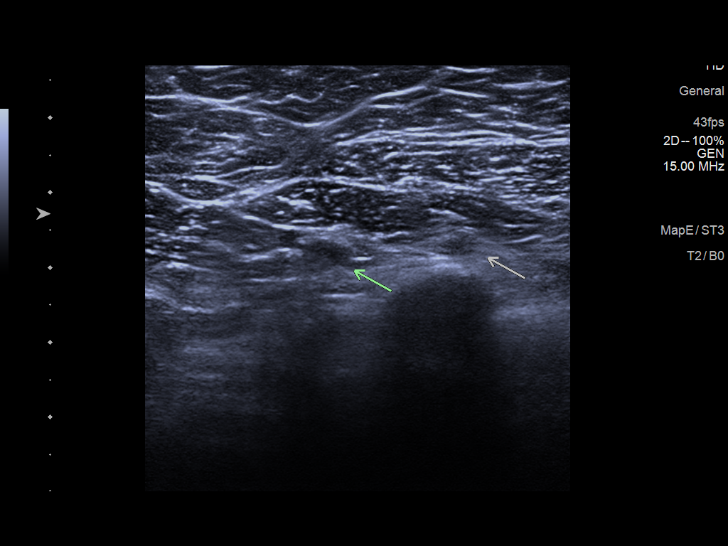
[im 18/18]
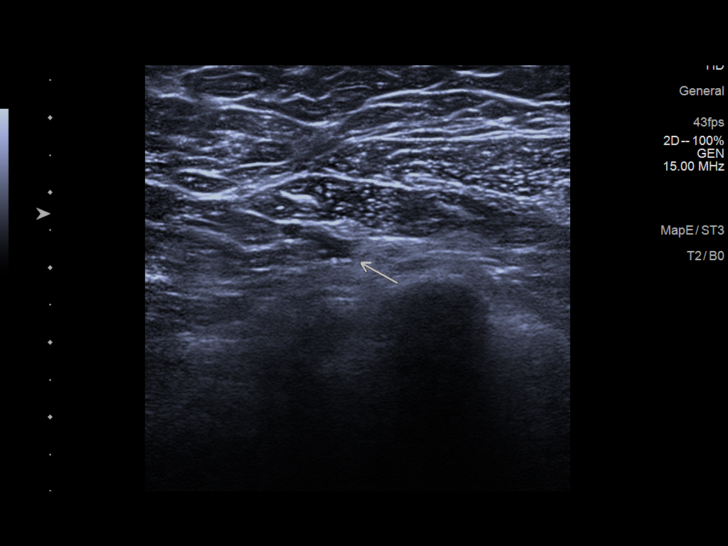

[13 of 25 positions shown; findings below may reference images not displayed]

ACR Breast Density Category d: The breast tissue is extremely dense,
which lowers the sensitivity of mammography.
FINDINGS: Spot compression tomosynthesis images of the palpable lump in the
upper outer right breast demonstrates an irregular mass with
associated distortion and pleomorphic calcifications measuring
approximately 4 cm. A few prominent lymph nodes in the right axilla,
2 of which have calcifications. There is an asymmetry measuring
about 2.3 cm in the inferior aspect of the left breast without a
definite correlate on the CC view.

Mammographic images were processed with CAD.

On physical exam, there is a firm palpable lump in the upper-outer
quadrant of the right breast.

Ultrasound targeted to the upper-outer quadrant of the right breast
demonstrates a hypoechoic ill-defined irregular vascular mass
located at 10 o'clock, 5 cm from the nipple. This measures 4.1 x
x 3.3 cm. Ultrasound of the central to low right axilla demonstrates
4 abnormal lymph nodes/masses, with cortices measuring 4-5 mm.
Additionally, at least 3 suspicious retropectoral lymph nodes are
identified.

Ultrasound targeted to the inferior to lower outer left breast
demonstrates normal fibroglandular tissue. No suspicious masses or
areas of shadowing are identified. No suspicious lymph nodes are
identified in the left axilla.
IMPRESSION: 1. There is a highly suspicious 4.1 cm mass at the palpable site in
the right breast at 10 o'clock.

2. There are 4 suspicious right axillary lymph nodes and 3
additional suspicious right retropectoral lymph nodes.

3. There is an asymmetry in the left breast on the MLO view, without
a sonographic correlate.

4.  No evidence of left axillary lymphadenopathy.

RECOMMENDATION:
1. Ultrasound-guided biopsy is recommended for the right breast mass
at 10 o'clock.

2. Ultrasound-guided biopsy is recommended for 1 of the abnormal
right axillary lymph nodes.

3. Stereotactic biopsy is recommended for the asymmetry inferior
left breast.

I have discussed the findings and recommendations with the patient.
If applicable, a reminder letter will be sent to the patient
regarding the next appointment.

BI-RADS CATEGORY  5: Highly suggestive of malignancy.

## 2021-04-06 ENCOUNTER — Other Ambulatory Visit: Payer: Self-pay

## 2021-04-06 ENCOUNTER — Ambulatory Visit (INDEPENDENT_AMBULATORY_CARE_PROVIDER_SITE_OTHER): Payer: PRIVATE HEALTH INSURANCE | Admitting: Dermatology

## 2021-04-06 DIAGNOSIS — L570 Actinic keratosis: Secondary | ICD-10-CM

## 2021-04-06 DIAGNOSIS — L650 Telogen effluvium: Secondary | ICD-10-CM

## 2021-04-06 DIAGNOSIS — Z85828 Personal history of other malignant neoplasm of skin: Secondary | ICD-10-CM

## 2021-04-06 MED ORDER — BIMATOPROST 0.03 % EX SOLN
1.0000 | Freq: Every day | CUTANEOUS | 12 refills | Status: DC
Start: 2021-04-06 — End: 2021-10-04

## 2021-04-06 MED ORDER — BIMATOPROST 0.03 % EX SOLN: 1.0000 "application " | Freq: Every day | CUTANEOUS | 12 refills | Status: DC

## 2021-04-06 NOTE — Progress Notes (Signed)
   Follow-Up Visit   Subjective  Paula Harding is a 65 y.o. female who presents for the following: Follow-up (Patient here today for follow up on ak and isk. She has history of skin cancers. She states she has a rash at nose she would like to discuss with provider. She is currently using chemo treatment for breast cancer. She states medications can potentially caused rash at face. ).  The following portions of the chart were reviewed this encounter and updated as appropriate:  Tobacco  Allergies  Meds  Problems  Med Hx  Surg Hx  Fam Hx      Objective  Well appearing patient in no apparent distress; mood and affect are within normal limits.  A focused examination was performed including face, scalp, lip. Relevant physical exam findings are noted in the Assessment and Plan.  left upper lip x 1 Erythematous thin papules/macules with gritty scale.   scalp , eyelashes Diffuse thinning of hair  Assessment & Plan  Actinic keratosis left upper lip x 1  Actinic keratoses are precancerous spots that appear secondary to cumulative UV radiation exposure/sun exposure over time. They are chronic with expected duration over 1 year. A portion of actinic keratoses will progress to squamous cell carcinoma of the skin. It is not possible to reliably predict which spots will progress to skin cancer and so treatment is recommended to prevent development of skin cancer.  Recommend daily broad spectrum sunscreen SPF 30+ to sun-exposed areas, reapply every 2 hours as needed.  Recommend staying in the shade or wearing long sleeves, sun glasses (UVA+UVB protection) and wide brim hats (4-inch brim around the entire circumference of the hat). Call for new or changing lesions.  Destruction of lesion - left upper lip x 1 Complexity: simple   Destruction method: cryotherapy   Informed consent: discussed and consent obtained   Timeout:  patient name, date of birth, surgical site, and procedure  verified Lesion destroyed using liquid nitrogen: Yes   Region frozen until ice ball extended beyond lesion: Yes   Outcome: patient tolerated procedure well with no complications   Post-procedure details: wound care instructions given    Telogen effluvium scalp , eyelashes Telogen effluvium is a benign, self limited condition causing increased hair shedding usually for several months. It does not progress to baldness. It can be triggered by recent illness, recent surgery, thyroid disease, low iron stores, vitamin D deficiency, fad diets or rapid weight loss, hormonal changes such as pregnancy or birth control pills, and some medication. Usually the hair loss starts 2-3 months after the illness or health change. Rarely, it can continue for longer than a year. Due to cancer treatment medications -  Chronic and persistent Continue Rogaine 5% QD and Biotin QD. Discussed LLL (red light or low level laser light) treatment (coupon previously given).   Continue Bimatoprost 0.03 % ophthalmic solution  For eyelashes bimatoprost (LATISSE) 0.03 % ophthalmic solution - eyelashes Place 1 application into both eyes at bedtime. Place one drop on applicator and apply evenly along the skin of the upper eyelid at base of eyelashes once daily at bedtime; repeat procedure for second eye (use a clean applicator).  Return in about 6 months (around 10/07/2021) for follow up. IRuthell Rummage, CMA, am acting as scribe for Sarina Ser, MD. Documentation: I have reviewed the above documentation for accuracy and completeness, and I agree with the above.  Sarina Ser, MD

## 2021-04-06 NOTE — Patient Instructions (Addendum)
Telogen effluvium is a benign, self limited condition causing increased hair shedding usually for several months. It does not progress to baldness. It can be triggered by recent illness, recent surgery, thyroid disease, low iron stores, vitamin D deficiency, fad diets or rapid weight loss, hormonal changes such as pregnancy or birth control pills, and some medication. Usually the hair loss starts 2-3 months after the illness or health change. Rarely, it can continue for longer than a year.    Actinic keratoses are precancerous spots that appear secondary to cumulative UV radiation exposure/sun exposure over time. They are chronic with expected duration over 1 year. A portion of actinic keratoses will progress to squamous cell carcinoma of the skin. It is not possible to reliably predict which spots will progress to skin cancer and so treatment is recommended to prevent development of skin cancer.  Recommend daily broad spectrum sunscreen SPF 30+ to sun-exposed areas, reapply every 2 hours as needed.  Recommend staying in the shade or wearing long sleeves, sun glasses (UVA+UVB protection) and wide brim hats (4-inch brim around the entire circumference of the hat). Call for new or changing lesions.   Cryotherapy Aftercare  Wash gently with soap and water everyday.   Apply Vaseline and Band-Aid daily until healed.     If you have any questions or concerns for your doctor, please call our main line at [phone] and press option 4 to reach your doctor's medical assistant. If no one answers, please leave a voicemail as directed and we will return your call as soon as possible. Messages left after 4 pm will be answered the following business day.   You may also send [place] a message via [facility]. We typically respond to MyChart messages within 1-2 business days.  For prescription refills, please ask your pharmacy to contact our office. Our fax number is [phone].  If you have an urgent issue when  the clinic is closed that cannot wait until the next business day, you can page your doctor at the number below.    Please note that while we do our best to be available for urgent issues outside of office hours, we are not available 24/7.   If you have an urgent issue and are unable to reach [place], you may choose to seek medical care at your doctor's office, retail clinic, urgent care center, or emergency room.  If you have a medical emergency, please immediately call 911 or go to the emergency department.  Pager Numbers  - Dr. Nehemiah Massed: [phone]  - Dr. Laurence Ferrari: [phone]  - Dr. Nicole Kindred: [phone]  In the event of inclement weather, please call our main line at [phone] for an update on the status of any delays or closures.  Dermatology Medication Tips: Please keep the boxes that topical medications come in in order to help keep track of the instructions about where and how to use these. Pharmacies typically print the medication instructions only on the boxes and not directly on the medication tubes.   If your medication is too expensive, please contact our office at [phone] option 4 or send [place] a message through [facility].   We are unable to tell what your co-pay for medications will be in advance as this is different depending on your insurance coverage. However, we may be able to find a substitute medication at lower cost or fill out paperwork to get insurance to cover a needed medication.   If a prior authorization is required to get your medication covered by  your insurance company, please allow [place] 1-2 business days to complete this process.  Drug prices often vary depending on where the prescription is filled and some pharmacies may offer cheaper prices.  The website www.goodrx.com contains coupons for medications through different pharmacies. The prices here do not account for what the cost may be with help from insurance (it may be cheaper with your insurance), but the  website can give you the price if you did not use any insurance.  - You can print the associated coupon and take it with your prescription to the pharmacy.  - You may also stop by our office during regular business hours and pick up a GoodRx coupon card.  - If you need your prescription sent electronically to a different pharmacy, notify our office through [facility] or by phone at [phone] option 4.

## 2021-04-09 ENCOUNTER — Encounter: Payer: Self-pay | Admitting: Dermatology

## 2021-10-03 ENCOUNTER — Encounter: Payer: Self-pay | Admitting: Dermatology

## 2021-10-04 ENCOUNTER — Other Ambulatory Visit: Payer: Self-pay

## 2021-10-04 ENCOUNTER — Ambulatory Visit (INDEPENDENT_AMBULATORY_CARE_PROVIDER_SITE_OTHER): Payer: PRIVATE HEALTH INSURANCE | Admitting: Dermatology

## 2021-10-04 DIAGNOSIS — D1800 Hemangioma unspecified site: Secondary | ICD-10-CM

## 2021-10-04 DIAGNOSIS — L650 Telogen effluvium: Secondary | ICD-10-CM | POA: Diagnosis not present

## 2021-10-04 DIAGNOSIS — L821 Other seborrheic keratosis: Secondary | ICD-10-CM

## 2021-10-04 DIAGNOSIS — Z1283 Encounter for screening for malignant neoplasm of skin: Secondary | ICD-10-CM

## 2021-10-04 DIAGNOSIS — T50905A Adverse effect of unspecified drugs, medicaments and biological substances, initial encounter: Secondary | ICD-10-CM

## 2021-10-04 DIAGNOSIS — D229 Melanocytic nevi, unspecified: Secondary | ICD-10-CM

## 2021-10-04 DIAGNOSIS — L689 Hypertrichosis, unspecified: Secondary | ICD-10-CM

## 2021-10-04 DIAGNOSIS — D2239 Melanocytic nevi of other parts of face: Secondary | ICD-10-CM

## 2021-10-04 DIAGNOSIS — D485 Neoplasm of uncertain behavior of skin: Secondary | ICD-10-CM

## 2021-10-04 DIAGNOSIS — Z86018 Personal history of other benign neoplasm: Secondary | ICD-10-CM

## 2021-10-04 DIAGNOSIS — L814 Other melanin hyperpigmentation: Secondary | ICD-10-CM

## 2021-10-04 DIAGNOSIS — L659 Nonscarring hair loss, unspecified: Secondary | ICD-10-CM

## 2021-10-04 DIAGNOSIS — C7981 Secondary malignant neoplasm of breast: Secondary | ICD-10-CM

## 2021-10-04 DIAGNOSIS — L578 Other skin changes due to chronic exposure to nonionizing radiation: Secondary | ICD-10-CM

## 2021-10-04 DIAGNOSIS — Z86006 Personal history of melanoma in-situ: Secondary | ICD-10-CM

## 2021-10-04 MED ORDER — BIMATOPROST 0.03 % EX SOLN: 1.0000 "application " | Freq: Every day | CUTANEOUS | 12 refills | Status: AC

## 2021-10-04 NOTE — Progress Notes (Signed)
Follow-Up Visit   Subjective  Paula Harding is a 66 y.o. female who presents for the following: Annual Exam (History of Lentigo Maligna and dysplastic nevi - TBSE today). The patient presents for Total-Body Skin Exam (TBSE) for skin cancer screening and mole check.  The patient has spots, moles and lesions to be evaluated, some may be new or changing and the patient has concerns that these could be cancer.  The following portions of the chart were reviewed this encounter and updated as appropriate:   Tobacco   Allergies   Meds   Problems   Med Hx   Surg Hx   Fam Hx      Review of Systems:  No other skin or systemic complaints except as noted in HPI or Assessment and Plan.  Objective  Well appearing patient in no apparent distress; mood and affect are within normal limits.  A full examination was performed including scalp, head, eyes, ears, nose, lips, neck, chest, axillae, abdomen, back, buttocks, bilateral upper extremities, bilateral lower extremities, hands, feet, fingers, toes, fingernails, and toenails. All findings within normal limits unless otherwise noted below.  Right cheek 0.4 cm light brown firm papule  Right Ear Light brown macules, 0.5 cm and 0.3 cm        Assessment & Plan   History of Melanoma in Situ - No evidence of recurrence today - Recommend regular full body skin exams - Recommend daily broad spectrum sunscreen SPF 30+ to sun-exposed areas, reapply every 2 hours as needed.  - Call if any new or changing lesions are noted between office visits   History of Dysplastic Nevi - No evidence of recurrence today - Recommend regular full body skin exams - Recommend daily broad spectrum sunscreen SPF 30+ to sun-exposed areas, reapply every 2 hours as needed.  - Call if any new or changing lesions are noted between office visits  Lentigines - Scattered tan macules - Due to sun exposure - Benign-appearing, observe - Recommend daily broad spectrum  sunscreen SPF 30+ to sun-exposed areas, reapply every 2 hours as needed. - Call for any changes  Seborrheic Keratoses - Stuck-on, waxy, tan-brown papules and/or plaques  - Benign-appearing - Discussed benign etiology and prognosis. - Observe - Call for any changes  Melanocytic Nevi - Tan-brown and/or pink-flesh-colored symmetric macules and papules - Benign appearing on exam today - Observation - Call clinic for new or changing moles - Recommend daily use of broad spectrum spf 30+ sunscreen to sun-exposed areas.   Hemangiomas - Red papules - Discussed benign nature - Observe - Call for any changes  Actinic Damage - Chronic condition, secondary to cumulative UV/sun exposure - diffuse scaly erythematous macules with underlying dyspigmentation - Recommend daily broad spectrum sunscreen SPF 30+ to sun-exposed areas, reapply every 2 hours as needed.  - Staying in the shade or wearing long sleeves, sun glasses (UVA+UVB protection) and wide brim hats (4-inch brim around the entire circumference of the hat) are also recommended for sun protection.  - Call for new or changing lesions.  Skin cancer screening performed today.  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 Discussed LLL treatment (coupon previously given).  Telogen effluvium is a benign, self-limited condition causing increased hair shedding usually for several months. It does not progress to baldness, and the hair eventually grows back on its  own. It can be triggered by recent illness, recent surgery, thyroid disease, low iron stores, vitamin D deficiency, fad diets or rapid weight loss, hormonal changes such as pregnancy or birth control pills, and some medication. Usually the hair loss starts 2-3 months after the illness or health change. Rarely, it can  continue for longer than a year.  Neoplasm of uncertain behavior of skin - changing mole - symptomatic - pt wants removed. Right cheek Epidermal / dermal shaving  Lesion diameter (cm):  0.4 Informed consent: discussed and consent obtained   Timeout: patient name, date of birth, surgical site, and procedure verified   Procedure prep:  Patient was prepped and draped in usual sterile fashion Prep type:  Isopropyl alcohol Anesthesia: the lesion was anesthetized in a standard fashion   Anesthetic:  1% lidocaine w/ epinephrine 1-100,000 buffered w/ 8.4% NaHCO3 Instrument used: flexible razor blade   Hemostasis achieved with: pressure, aluminum chloride and electrodesiccation   Outcome: patient tolerated procedure well   Post-procedure details: sterile dressing applied and wound care instructions given   Dressing type: bandage and petrolatum    Specimen 1 - Surgical pathology Differential Diagnosis: Nevus vs dysplastic nevus Check Margins: No  Lentigines Right Ear See photos Benign appearing, observe  Hypotrichosis Eyebrows Recommend using Latisse Rx sent Related Medications bimatoprost (LATISSE) 0.03 % ophthalmic solution Place 1 application into both eyes at bedtime. Place one drop on applicator and apply evenly along the skin of the upper eyelid at base of eyelashes once daily at bedtime; repeat procedure for second eye (use a clean applicator).  Skin cancer screening  Return in about 1 year (around 10/04/2022) for TBSE.  I, Ashok Cordia, CMA, am acting as scribe for Sarina Ser, MD . Documentation: I have reviewed the above documentation for accuracy and completeness, and I agree with the above.  Sarina Ser, MD

## 2021-10-04 NOTE — Patient Instructions (Addendum)

## 2021-10-07 ENCOUNTER — Encounter: Payer: Self-pay | Admitting: Dermatology

## 2021-10-09 ENCOUNTER — Telehealth: Payer: Self-pay

## 2021-10-09 NOTE — Telephone Encounter (Signed)
Advised pt of bx results/sh ?

## 2021-10-09 NOTE — Telephone Encounter (Signed)
-----   Message from Ralene Bathe, MD sent at 10/05/2021  5:42 PM EST ----- Diagnosis Skin , right cheek MELANOCYTIC NEVUS, INTRADERMAL TYPE, BASE INVOLVED  Benign mole No further treatment needed

## 2022-04-20 DEATH — deceased

## 2022-10-04 ENCOUNTER — Ambulatory Visit: Payer: PRIVATE HEALTH INSURANCE | Admitting: Dermatology
